# Patient Record
Sex: Female | Born: 1935 | Race: White | Hispanic: No | Smoking: Never smoker
Health system: Southern US, Community
[De-identification: ages and names within clinical notes are randomized; demographics above are authoritative.]

## PROBLEM LIST (undated history)

## (undated) DIAGNOSIS — E785 Hyperlipidemia, unspecified: Secondary | ICD-10-CM

## (undated) DIAGNOSIS — I2510 Atherosclerotic heart disease of native coronary artery without angina pectoris: Secondary | ICD-10-CM

## (undated) DIAGNOSIS — I10 Essential (primary) hypertension: Secondary | ICD-10-CM

## (undated) DIAGNOSIS — E039 Hypothyroidism, unspecified: Secondary | ICD-10-CM

## (undated) DIAGNOSIS — K219 Gastro-esophageal reflux disease without esophagitis: Secondary | ICD-10-CM

## (undated) DIAGNOSIS — F0390 Unspecified dementia without behavioral disturbance: Secondary | ICD-10-CM

## (undated) DIAGNOSIS — I213 ST elevation (STEMI) myocardial infarction of unspecified site: Secondary | ICD-10-CM

## (undated) DIAGNOSIS — I639 Cerebral infarction, unspecified: Secondary | ICD-10-CM

## (undated) HISTORY — DX: Atherosclerotic heart disease of native coronary artery without angina pectoris: I25.10

## (undated) HISTORY — DX: Hypothyroidism, unspecified: E03.9

## (undated) HISTORY — DX: Gastro-esophageal reflux disease without esophagitis: K21.9

---

## 2004-10-28 ENCOUNTER — Ambulatory Visit (HOSPITAL_COMMUNITY): Admission: RE | Admit: 2004-10-28 | Discharge: 2004-10-28 | Payer: Self-pay | Admitting: Internal Medicine

## 2005-12-12 ENCOUNTER — Ambulatory Visit (HOSPITAL_COMMUNITY): Admission: RE | Admit: 2005-12-12 | Discharge: 2005-12-12 | Payer: Self-pay | Admitting: Internal Medicine

## 2006-05-26 ENCOUNTER — Ambulatory Visit: Payer: Self-pay | Admitting: Vascular Surgery

## 2007-07-04 ENCOUNTER — Ambulatory Visit: Payer: Self-pay | Admitting: Vascular Surgery

## 2008-07-09 ENCOUNTER — Ambulatory Visit: Payer: Self-pay | Admitting: Vascular Surgery

## 2009-07-01 ENCOUNTER — Ambulatory Visit: Payer: Self-pay | Admitting: Vascular Surgery

## 2009-09-15 ENCOUNTER — Inpatient Hospital Stay (HOSPITAL_COMMUNITY): Admission: EM | Admit: 2009-09-15 | Discharge: 2009-09-21 | Payer: Self-pay | Admitting: Emergency Medicine

## 2009-09-15 ENCOUNTER — Ambulatory Visit: Payer: Self-pay | Admitting: Cardiology

## 2009-09-16 ENCOUNTER — Encounter (INDEPENDENT_AMBULATORY_CARE_PROVIDER_SITE_OTHER): Payer: Self-pay | Admitting: Internal Medicine

## 2010-05-08 LAB — BASIC METABOLIC PANEL
BUN: 18 mg/dL (ref 6–23)
BUN: 29 mg/dL — ABNORMAL HIGH (ref 6–23)
CO2: 22 mEq/L (ref 19–32)
CO2: 25 mEq/L (ref 19–32)
Calcium: 8.5 mg/dL (ref 8.4–10.5)
Chloride: 106 mEq/L (ref 96–112)
Chloride: 106 mEq/L (ref 96–112)
Chloride: 98 mEq/L (ref 96–112)
Creatinine, Ser: 1.03 mg/dL (ref 0.4–1.2)
Creatinine, Ser: 1.31 mg/dL — ABNORMAL HIGH (ref 0.4–1.2)
GFR calc Af Amer: >60 mL/min (ref >60)
GFR calc Af Amer: >60 mL/min (ref >60)
GFR calc non Af Amer: 48 mL/min — ABNORMAL LOW (ref >60)
Glucose, Bld: 180 mg/dL — ABNORMAL HIGH (ref 70–99)
Potassium: 3.8 mEq/L (ref 3.5–5.1)
Potassium: 3.8 mEq/L (ref 3.5–5.1)

## 2010-05-08 LAB — CBC
HCT: 36.8 % (ref 36.0–46.0)
HCT: 39 % (ref 36.0–46.0)
HCT: 44.5 % (ref 36.0–46.0)
Hemoglobin: 13.2 g/dL (ref 12.0–15.0)
MCH: 30.9 pg (ref 26.0–34.0)
MCH: 31.1 pg (ref 26.0–34.0)
MCH: 31.5 pg (ref 26.0–34.0)
MCHC: 33.9 g/dL (ref 30.0–36.0)
MCHC: 34.4 g/dL (ref 30.0–36.0)
MCHC: 34.6 g/dL (ref 30.0–36.0)
MCV: 90.6 fL (ref 78.0–100.0)
MCV: 91 fL (ref 78.0–100.0)
MCV: 91.1 fL (ref 78.0–100.0)
MCV: 91.5 fL (ref 78.0–100.0)
Platelets: 122 10*3/uL — ABNORMAL LOW (ref 150–400)
Platelets: 132 10*3/uL — ABNORMAL LOW (ref 150–400)
Platelets: 152 10*3/uL (ref 150–400)
RBC: 4.06 MIL/uL (ref 3.87–5.11)
RBC: 4.28 MIL/uL (ref 3.87–5.11)
RDW: 14 % (ref 11.5–15.5)
RDW: 14 % (ref 11.5–15.5)
RDW: 14.1 % (ref 11.5–15.5)
RDW: 14.3 % (ref 11.5–15.5)
WBC: 11.2 10*3/uL — ABNORMAL HIGH (ref 4.0–10.5)
WBC: 12.1 10*3/uL — ABNORMAL HIGH (ref 4.0–10.5)
WBC: 14.3 10*3/uL — ABNORMAL HIGH (ref 4.0–10.5)

## 2010-05-08 LAB — DIFFERENTIAL
Basophils Absolute: 0 10*3/uL (ref 0.0–0.1)
Basophils Absolute: 0 10*3/uL (ref 0.0–0.1)
Basophils Relative: 0 % (ref 0–1)
Basophils Relative: 0 % (ref 0–1)
Basophils Relative: 0 % (ref 0–1)
Eosinophils Absolute: 0 10*3/uL (ref 0.0–0.7)
Eosinophils Absolute: 0 10*3/uL (ref 0.0–0.7)
Eosinophils Absolute: 0 10*3/uL (ref 0.0–0.7)
Eosinophils Relative: 0 % (ref 0–5)
Eosinophils Relative: 0 % (ref 0–5)
Monocytes Absolute: 0.6 10*3/uL (ref 0.1–1.0)
Monocytes Absolute: 0.6 10*3/uL (ref 0.1–1.0)
Monocytes Relative: 4 % (ref 3–12)
Neutrophils Relative %: 92 % — ABNORMAL HIGH (ref 43–77)

## 2010-05-08 LAB — COMPREHENSIVE METABOLIC PANEL
ALT: 31 U/L (ref 0–35)
Alkaline Phosphatase: 43 U/L (ref 39–117)
Glucose, Bld: 144 mg/dL — ABNORMAL HIGH (ref 70–99)
Potassium: 3.7 mEq/L (ref 3.5–5.1)
Sodium: 136 mEq/L (ref 135–145)
Total Protein: 4.9 g/dL — ABNORMAL LOW (ref 6.0–8.3)

## 2010-05-08 LAB — URINALYSIS, ROUTINE W REFLEX MICROSCOPIC
Glucose, UA: NEGATIVE mg/dL
Leukocytes, UA: NEGATIVE
Protein, ur: 30 mg/dL — AB

## 2010-05-08 LAB — CULTURE, BLOOD (ROUTINE X 2): Culture: NO GROWTH

## 2010-05-08 LAB — CARDIAC PANEL(CRET KIN+CKTOT+MB+TROPI)
Relative Index: 3.7 — ABNORMAL HIGH (ref 0.0–2.5)
Relative Index: 3.9 — ABNORMAL HIGH (ref 0.0–2.5)
Relative Index: 5 — ABNORMAL HIGH (ref 0.0–2.5)
Relative Index: 5.9 — ABNORMAL HIGH (ref 0.0–2.5)
Total CK: 257 U/L — ABNORMAL HIGH (ref 7–177)
Total CK: 411 U/L — ABNORMAL HIGH (ref 7–177)
Troponin I: 1.3 ng/mL (ref 0.00–0.06)
Troponin I: 14.57 ng/mL (ref 0.00–0.06)
Troponin I: 5.99 ng/mL (ref 0.00–0.06)
Troponin I: 8.32 ng/mL (ref 0.00–0.06)

## 2010-05-08 LAB — POCT CARDIAC MARKERS

## 2010-05-08 LAB — TSH: TSH: 0.763 u[IU]/mL (ref 0.350–4.500)

## 2010-05-08 LAB — CLOSTRIDIUM DIFFICILE EIA

## 2010-05-08 LAB — LIPID PANEL
LDL Cholesterol: 57 mg/dL (ref 0–99)
Triglycerides: 70 mg/dL (ref <150)

## 2010-05-08 LAB — URINE MICROSCOPIC-ADD ON

## 2010-07-06 NOTE — Procedures (Signed)
CAROTID DUPLEX EXAM   INDICATION:  Follow up known carotid artery disease.   HISTORY:  Diabetes:  No.  Cardiac:  No.  Hypertension:  Yes.  Smoking:  No.  Previous Surgery:  No.  CV History:  No.  Amaurosis Fugax No, Paresthesias No, Hemiparesis No.                                       RIGHT             LEFT  Brachial systolic pressure:         210               190  Brachial Doppler waveforms:         WNL               WNL  Vertebral direction of flow:        Antegrade         Antegrade  DUPLEX VELOCITIES (cm/sec)  CCA peak systolic                   66                67  ECA peak systolic                   94                109  ICA peak systolic                   64                171  ICA end diastolic                   20                52  PLAQUE MORPHOLOGY:                  Calcific          Heterogenous  PLAQUE AMOUNT:                      Mild              Moderate  PLAQUE LOCATION:                    Bifurcation/ICA   Bifurcation/ICA   IMPRESSION:  1. Right internal carotid artery suggests 20% to 39% stenosis.  2. Left internal carotid artery suggests 40% to 59% stenosis.  3. Antegrade flow in bilateral vertebrals.   ___________________________________________  Janetta Hora Fields, MD   CB/MEDQ  D:  07/01/2009  T:  07/01/2009  Job:  [id number]

## 2010-07-06 NOTE — Procedures (Signed)
CAROTID DUPLEX EXAM   INDICATION:  Follow-up evaluation of known carotid artery disease.   HISTORY:  Diabetes:  No.  Cardiac:  No.  Hypertension:  Yes.  Smoking:  No.  Previous Surgery:  No.  CV History:  Patient reports no cerebrovascular symptoms at this time.  Previous duplex performed 05/26/06 revealed a 20-39% right ICA stenosis  and a 40-59% left ICA stenosis.  Amaurosis Fugax No, Paresthesias No, Hemiparesis No                                       RIGHT             LEFT  Brachial systolic pressure:         198               200  Brachial Doppler waveforms:         Triphasic         Triphasic  Vertebral direction of flow:        Antegrade         Antegrade  DUPLEX VELOCITIES (cm/sec)  CCA peak systolic                   52                58  ECA peak systolic                   79                75  ICA peak systolic                   66                152  ICA end diastolic                   15                31  PLAQUE MORPHOLOGY:                  Calcified, irregular                Calcified, irregular  PLAQUE AMOUNT:                      Moderate-to-severe                  Moderate  PLAQUE LOCATION:                    Proximal ICA      Proximal ICA   IMPRESSION:  1. 20-39% right internal carotid artery stenosis.  2. 40-59% left internal carotid artery stenosis.  3. No significant change from previous study performed 05/26/06.   ___________________________________________  Janetta Hora. Fields, MD   MC/MEDQ  D:  07/04/2007  T:  07/04/2007  Job:  [id number]

## 2010-07-06 NOTE — Procedures (Signed)
CAROTID DUPLEX EXAM   INDICATION:  Followup of known carotid artery disease.   HISTORY:  Diabetes:  No.  Cardiac:  No.  Hypertension:  Yes.  Smoking:  No.  Previous Surgery:  No.  CV History:  No.  Amaurosis Fugax No, Paresthesias No, Hemiparesis No.                                       RIGHT             LEFT  Brachial systolic pressure:         90  Brachial Doppler waveforms:         Biphasic          Biphasic  Vertebral direction of flow:        Antegrade         Antegrade  DUPLEX VELOCITIES (cm/sec)  CCA peak systolic                   46                160  ECA peak systolic                   75                98  ICA peak systolic                   88                188  ICA end diastolic                   18                47  PLAQUE MORPHOLOGY:                  Calcified         Heterogenous  PLAQUE AMOUNT:                      Moderate-to-severe                  Moderate-to-severe  PLAQUE LOCATION:                    BIF, ICA          BIF, ICA   IMPRESSION:  1. 20-39% right internal carotid artery stenosis.  2. 40-59% left internal carotid artery stenosis.     ___________________________________________  Janetta Hora Fields, MD   AC/MEDQ  D:  07/09/2008  T:  07/09/2008  Job:  [id number]

## 2010-07-08 ENCOUNTER — Other Ambulatory Visit: Payer: Self-pay

## 2011-09-14 IMAGING — CT CT HEAD W/O CM
1 of 5 series · 15 of 30 positions shown, 19 images · non-contrast
Comparison: None

CLINICAL DATA: Weakness.  Dehydration.  Fall.  Headache.

CT HEAD WITHOUT CONTRAST
TECHNIQUE: Contiguous axial images were obtained from the base of
the skull through the vertex without contrast.

[Series 8: head routine 2.4 h60s · axial · 0.43mm/px · z∈[+1134,+1266]mm · 15 of 60 slices shown, 19 images]
[im 4/60  brain]
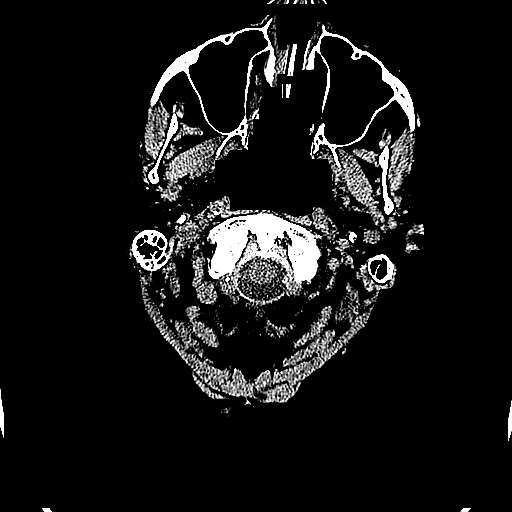
[im 4/60  bone]
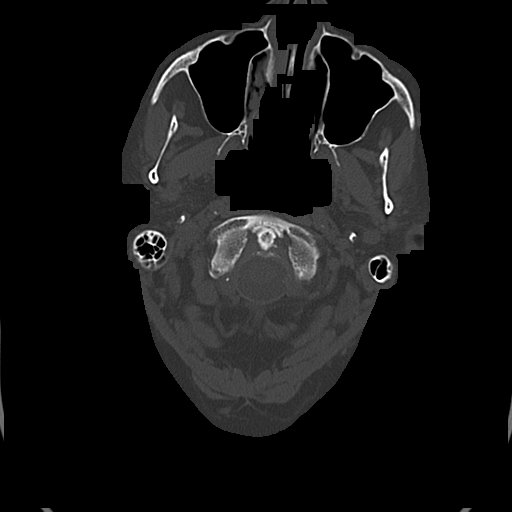
[im 8/60  brain]
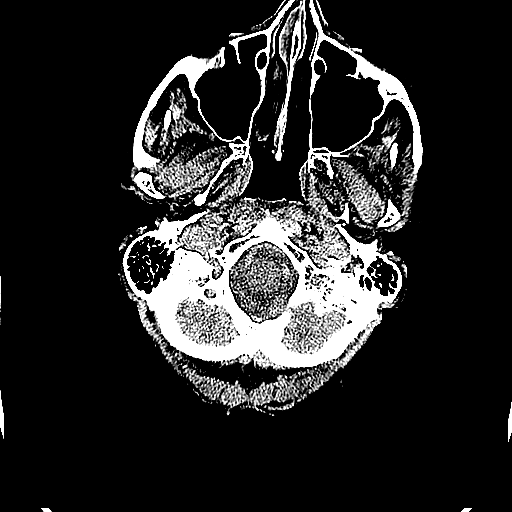
[im 12/60  brain]
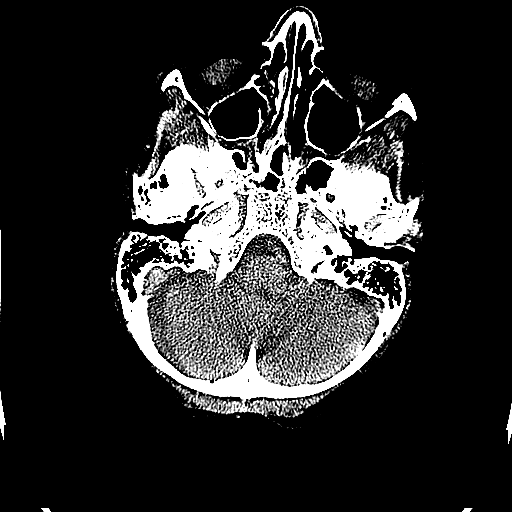
[im 15/60  brain]
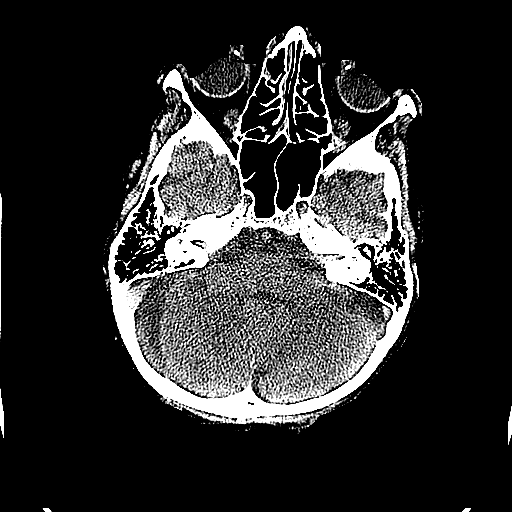
[im 19/60  brain]
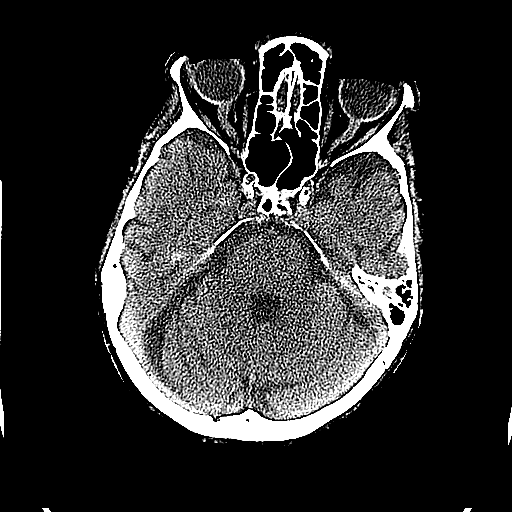
[im 19/60  bone]
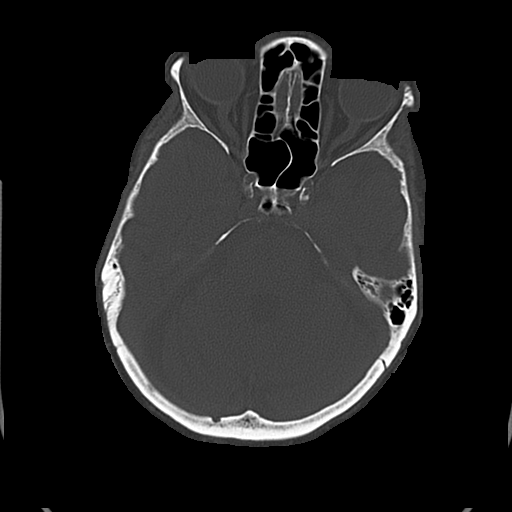
[im 23/60  brain]
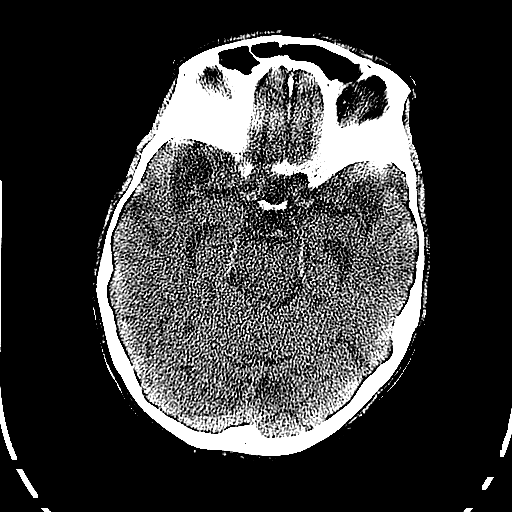
[im 26/60  brain]
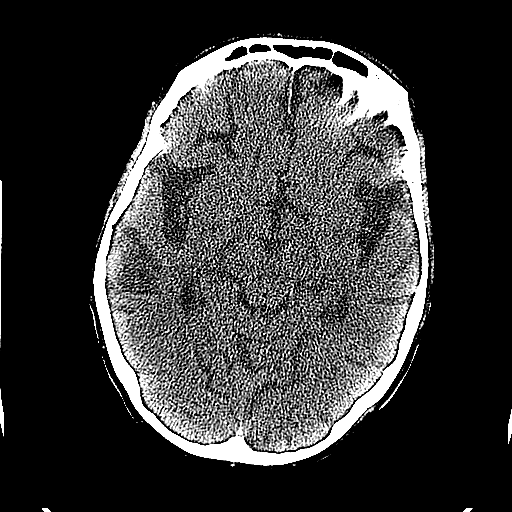
[im 30/60  brain]
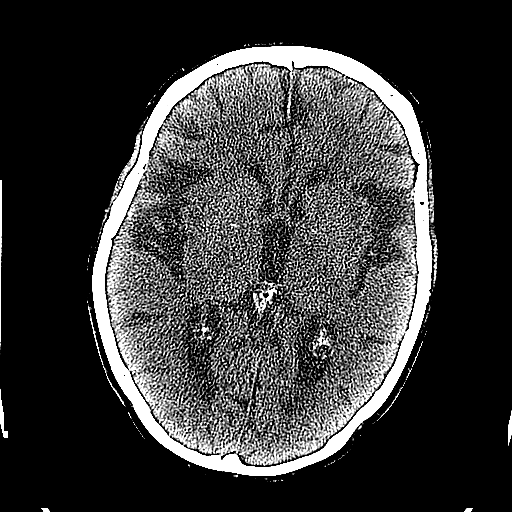
[im 34/60  brain]
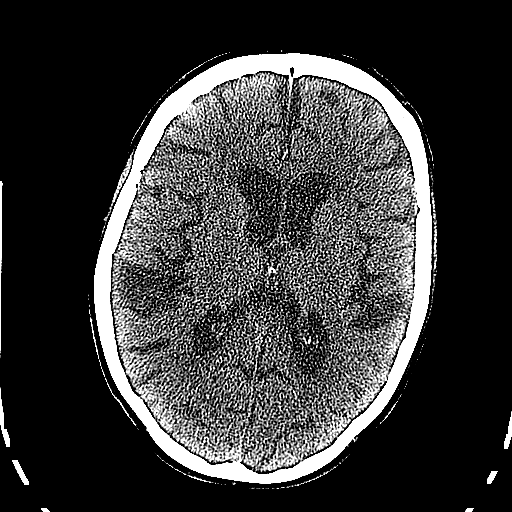
[im 34/60  bone]
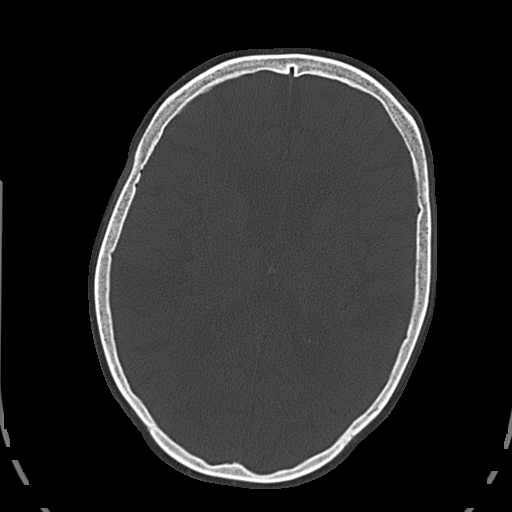
[im 37/60  brain]
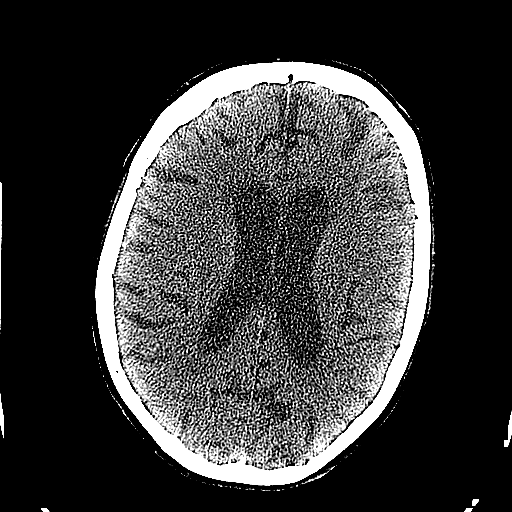
[im 41/60  brain]
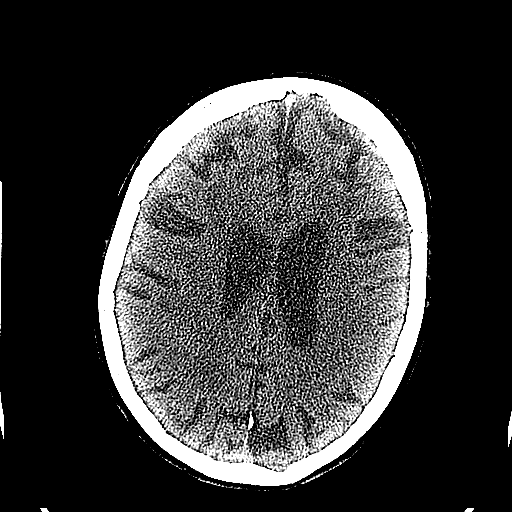
[im 45/60  brain]
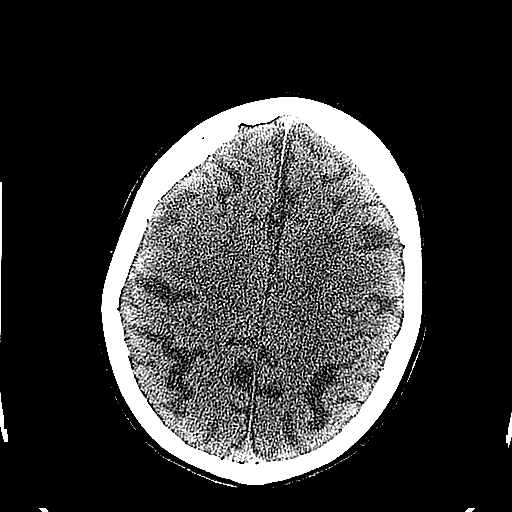
[im 48/60  brain]
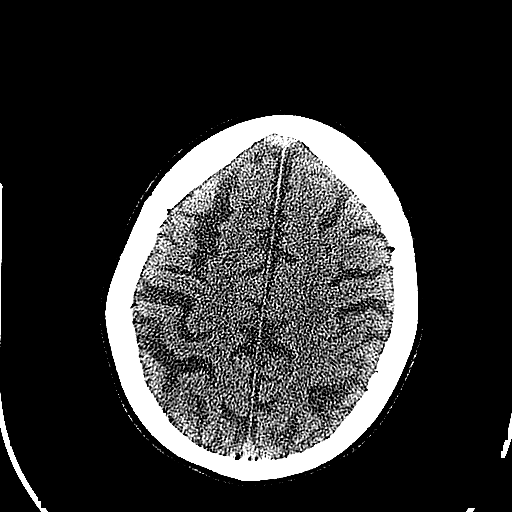
[im 48/60  bone]
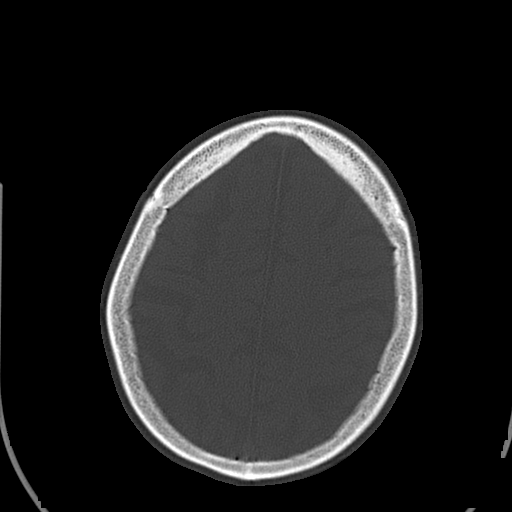
[im 52/60  brain]
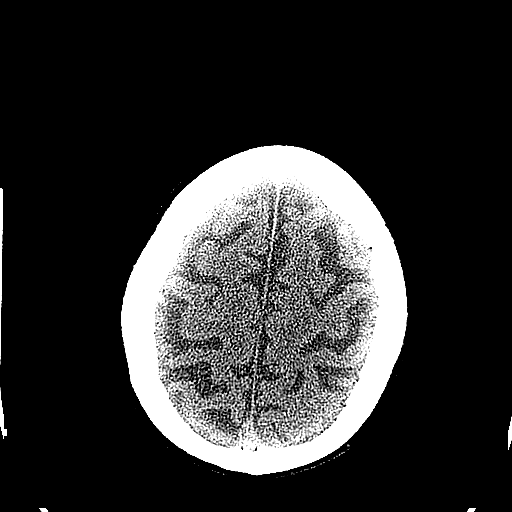
[im 56/60  brain]
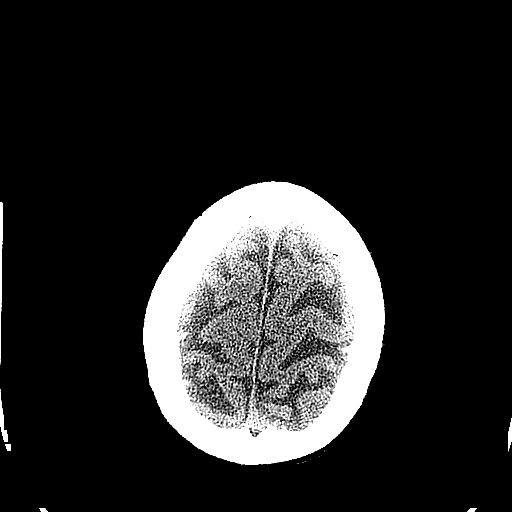

[15 of 30 positions shown; findings below may reference images not displayed]

FINDINGS: There is pronounced generalized brain atrophy.  There are
changes of chronic small vessel disease effect the hemispheric
white matter.  No sign of acute infarction, mass lesion,
hemorrhage, hydrocephalus or subdural hematoma.  There are low
density hygromas in the posterior fossa, not of any clinical
relevance.  No skull fractures seen.  No traumatic fluid in the
sinuses, middle ears or mastoids.
IMPRESSION: No acute finding.  No traumatic finding.  Atrophy and chronic small
vessel disease.

## 2011-12-13 ENCOUNTER — Encounter: Payer: Self-pay | Admitting: Vascular Surgery

## 2012-09-14 ENCOUNTER — Encounter (HOSPITAL_COMMUNITY): Payer: Self-pay

## 2012-09-14 ENCOUNTER — Inpatient Hospital Stay (HOSPITAL_COMMUNITY)
Admission: EM | Admit: 2012-09-14 | Discharge: 2012-09-22 | DRG: 871 | Disposition: A | Discharge status: Home Health | Payer: Medicare Other | Attending: Internal Medicine | Admitting: Internal Medicine

## 2012-09-14 ENCOUNTER — Emergency Department (HOSPITAL_COMMUNITY): Payer: Medicare Other

## 2012-09-14 ENCOUNTER — Inpatient Hospital Stay (HOSPITAL_COMMUNITY): Payer: Medicare Other

## 2012-09-14 ENCOUNTER — Observation Stay (HOSPITAL_COMMUNITY): Payer: Medicare Other

## 2012-09-14 DIAGNOSIS — I5020 Unspecified systolic (congestive) heart failure: Secondary | ICD-10-CM

## 2012-09-14 DIAGNOSIS — R112 Nausea with vomiting, unspecified: Secondary | ICD-10-CM

## 2012-09-14 DIAGNOSIS — I509 Heart failure, unspecified: Secondary | ICD-10-CM

## 2012-09-14 DIAGNOSIS — I2510 Atherosclerotic heart disease of native coronary artery without angina pectoris: Secondary | ICD-10-CM | POA: Diagnosis present

## 2012-09-14 DIAGNOSIS — I252 Old myocardial infarction: Secondary | ICD-10-CM

## 2012-09-14 DIAGNOSIS — E874 Mixed disorder of acid-base balance: Secondary | ICD-10-CM | POA: Diagnosis present

## 2012-09-14 DIAGNOSIS — J9600 Acute respiratory failure, unspecified whether with hypoxia or hypercapnia: Secondary | ICD-10-CM | POA: Diagnosis present

## 2012-09-14 DIAGNOSIS — R410 Disorientation, unspecified: Secondary | ICD-10-CM

## 2012-09-14 DIAGNOSIS — N170 Acute kidney failure with tubular necrosis: Secondary | ICD-10-CM | POA: Diagnosis present

## 2012-09-14 DIAGNOSIS — D72829 Elevated white blood cell count, unspecified: Secondary | ICD-10-CM

## 2012-09-14 DIAGNOSIS — J9601 Acute respiratory failure with hypoxia: Secondary | ICD-10-CM

## 2012-09-14 DIAGNOSIS — R509 Fever, unspecified: Secondary | ICD-10-CM

## 2012-09-14 DIAGNOSIS — N390 Urinary tract infection, site not specified: Secondary | ICD-10-CM | POA: Diagnosis present

## 2012-09-14 DIAGNOSIS — F0390 Unspecified dementia without behavioral disturbance: Secondary | ICD-10-CM | POA: Diagnosis present

## 2012-09-14 DIAGNOSIS — J189 Pneumonia, unspecified organism: Secondary | ICD-10-CM

## 2012-09-14 DIAGNOSIS — I10 Essential (primary) hypertension: Secondary | ICD-10-CM | POA: Diagnosis present

## 2012-09-14 DIAGNOSIS — R404 Transient alteration of awareness: Secondary | ICD-10-CM | POA: Diagnosis present

## 2012-09-14 DIAGNOSIS — R5381 Other malaise: Secondary | ICD-10-CM | POA: Diagnosis present

## 2012-09-14 DIAGNOSIS — N289 Disorder of kidney and ureter, unspecified: Secondary | ICD-10-CM

## 2012-09-14 DIAGNOSIS — I5023 Acute on chronic systolic (congestive) heart failure: Secondary | ICD-10-CM

## 2012-09-14 DIAGNOSIS — I214 Non-ST elevation (NSTEMI) myocardial infarction: Secondary | ICD-10-CM

## 2012-09-14 DIAGNOSIS — N179 Acute kidney failure, unspecified: Secondary | ICD-10-CM

## 2012-09-14 DIAGNOSIS — R7989 Other specified abnormal findings of blood chemistry: Secondary | ICD-10-CM

## 2012-09-14 DIAGNOSIS — R32 Unspecified urinary incontinence: Secondary | ICD-10-CM | POA: Diagnosis present

## 2012-09-14 DIAGNOSIS — I5043 Acute on chronic combined systolic (congestive) and diastolic (congestive) heart failure: Secondary | ICD-10-CM | POA: Diagnosis not present

## 2012-09-14 DIAGNOSIS — E785 Hyperlipidemia, unspecified: Secondary | ICD-10-CM | POA: Diagnosis present

## 2012-09-14 DIAGNOSIS — R7309 Other abnormal glucose: Secondary | ICD-10-CM | POA: Diagnosis present

## 2012-09-14 DIAGNOSIS — R6520 Severe sepsis without septic shock: Secondary | ICD-10-CM

## 2012-09-14 DIAGNOSIS — E876 Hypokalemia: Secondary | ICD-10-CM | POA: Diagnosis present

## 2012-09-14 DIAGNOSIS — E872 Acidosis, unspecified: Secondary | ICD-10-CM

## 2012-09-14 DIAGNOSIS — J690 Pneumonitis due to inhalation of food and vomit: Secondary | ICD-10-CM | POA: Diagnosis present

## 2012-09-14 DIAGNOSIS — A419 Sepsis, unspecified organism: Principal | ICD-10-CM

## 2012-09-14 HISTORY — DX: Unspecified dementia, unspecified severity, without behavioral disturbance, psychotic disturbance, mood disturbance, and anxiety: F03.90

## 2012-09-14 HISTORY — DX: Atherosclerotic heart disease of native coronary artery without angina pectoris: I25.10

## 2012-09-14 HISTORY — DX: Cerebral infarction, unspecified: I63.9

## 2012-09-14 HISTORY — DX: Hyperlipidemia, unspecified: E78.5

## 2012-09-14 HISTORY — DX: Essential (primary) hypertension: I10

## 2012-09-14 HISTORY — DX: ST elevation (STEMI) myocardial infarction of unspecified site: I21.3

## 2012-09-14 LAB — URINALYSIS W MICROSCOPIC + REFLEX CULTURE
Bilirubin Urine: NEGATIVE
Hgb urine dipstick: NEGATIVE
Specific Gravity, Urine: 1.013 (ref 1.005–1.030)
pH: 7 (ref 5.0–8.0)

## 2012-09-14 LAB — CBC WITH DIFFERENTIAL/PLATELET
Basophils Relative: 0 % (ref 0–1)
Eosinophils Absolute: 0 10*3/uL (ref 0.0–0.7)
Hemoglobin: 14.6 g/dL (ref 12.0–15.0)
Lymphs Abs: 0.4 10*3/uL — ABNORMAL LOW (ref 0.7–4.0)
MCH: 30.4 pg (ref 26.0–34.0)
MCHC: 34.8 g/dL (ref 30.0–36.0)
Monocytes Relative: 2 % — ABNORMAL LOW (ref 3–12)
Neutro Abs: 29.1 10*3/uL — ABNORMAL HIGH (ref 1.7–7.7)
Neutrophils Relative %: 97 % — ABNORMAL HIGH (ref 43–77)
Platelets: 274 10*3/uL (ref 150–400)
RBC: 4.8 MIL/uL (ref 3.87–5.11)
WBC Morphology: INCREASED

## 2012-09-14 LAB — COMPREHENSIVE METABOLIC PANEL
AST: 23 U/L (ref 0–37)
Albumin: 3.8 g/dL (ref 3.5–5.2)
BUN: 20 mg/dL (ref 6–23)
Chloride: 96 mEq/L (ref 96–112)
Creatinine, Ser: 1.21 mg/dL — ABNORMAL HIGH (ref 0.50–1.10)
Total Protein: 6.9 g/dL (ref 6.0–8.3)

## 2012-09-14 LAB — LACTIC ACID, PLASMA
Lactic Acid, Venous: 6.4 mmol/L — ABNORMAL HIGH (ref 0.5–2.2)
Lactic Acid, Venous: 6.8 mmol/L — ABNORMAL HIGH (ref 0.5–2.2)
Lactic Acid, Venous: 2.8 mmol/L — ABNORMAL HIGH (ref 0.5–2.2)

## 2012-09-14 LAB — PRO B NATRIURETIC PEPTIDE: Pro B Natriuretic peptide (BNP): 3827 pg/mL — ABNORMAL HIGH (ref 0–450)

## 2012-09-14 LAB — TROPONIN I
Troponin I: 0.44 ng/mL (ref <0.30)
Troponin I: 0.45 ng/mL (ref <0.30)
Troponin I: 1.87 ng/mL (ref <0.30)

## 2012-09-14 LAB — LIPASE, BLOOD: Lipase: 15 U/L (ref 11–59)

## 2012-09-14 MED ORDER — DEXTROSE-NACL 5-0.45 % IV SOLN
INTRAVENOUS | Status: DC
  Administered 2012-09-14: 19:00:00 via INTRAVENOUS

## 2012-09-14 MED ORDER — SODIUM CHLORIDE 0.9 % IJ SOLN
3.0000 mL | Freq: Two times a day (BID) | INTRAMUSCULAR | Status: DC
  Administered 2012-09-14 – 2012-09-18 (×5): 3 mL via INTRAVENOUS

## 2012-09-14 MED ORDER — SODIUM CHLORIDE 0.9 % IV SOLN
INTRAVENOUS | Status: DC
  Administered 2012-09-14: 12:00:00 via INTRAVENOUS

## 2012-09-14 MED ORDER — ALBUTEROL SULFATE (5 MG/ML) 0.5% IN NEBU
2.5000 mg | INHALATION_SOLUTION | RESPIRATORY_TRACT | Status: DC | PRN
Start: 2012-09-14 — End: 2012-09-17

## 2012-09-14 MED ORDER — PIPERACILLIN-TAZOBACTAM 3.375 G IVPB
3.3750 g | Freq: Three times a day (TID) | INTRAVENOUS | Status: DC
  Filled 2012-09-14: qty 50

## 2012-09-14 MED ORDER — ALBUTEROL SULFATE (5 MG/ML) 0.5% IN NEBU
5.0000 mg | INHALATION_SOLUTION | Freq: Four times a day (QID) | RESPIRATORY_TRACT | Status: DC
  Administered 2012-09-14: 5 mg via RESPIRATORY_TRACT
  Filled 2012-09-14: qty 1

## 2012-09-14 MED ORDER — PIPERACILLIN-TAZOBACTAM 3.375 G IVPB 30 MIN
3.3750 g | Freq: Once | INTRAVENOUS | Status: DC
  Administered 2012-09-14: 3.375 g via INTRAVENOUS
  Filled 2012-09-14: qty 50

## 2012-09-14 MED ORDER — ONDANSETRON HCL 4 MG/2ML IJ SOLN: 4.0000 mg | Freq: Four times a day (QID) | INTRAMUSCULAR | Status: DC | PRN

## 2012-09-14 MED ORDER — VANCOMYCIN HCL IN DEXTROSE 750-5 MG/150ML-% IV SOLN
750.0000 mg | INTRAVENOUS | Status: DC
  Administered 2012-09-14: 750 mg via INTRAVENOUS
  Filled 2012-09-14: qty 150

## 2012-09-14 MED ORDER — SODIUM CHLORIDE 0.9 % IJ SOLN: 3.0000 mL | INTRAMUSCULAR | Status: DC | PRN

## 2012-09-14 MED ORDER — DEXTROSE 5 % IV SOLN
1.0000 g | INTRAVENOUS | Status: DC
  Filled 2012-09-14: qty 10

## 2012-09-14 MED ORDER — ACETAMINOPHEN 325 MG PO TABS
650.0000 mg | ORAL_TABLET | Freq: Four times a day (QID) | ORAL | Status: DC | PRN
  Administered 2012-09-16: 650 mg via ORAL
  Filled 2012-09-14: qty 2

## 2012-09-14 MED ORDER — ACETAMINOPHEN 650 MG RE SUPP
650.0000 mg | Freq: Four times a day (QID) | RECTAL | Status: DC | PRN
  Administered 2012-09-15 – 2012-09-16 (×2): 650 mg via RECTAL
  Filled 2012-09-14 (×2): qty 1

## 2012-09-14 MED ORDER — CLOPIDOGREL BISULFATE 75 MG PO TABS
75.0000 mg | ORAL_TABLET | Freq: Every day | ORAL | Status: DC
  Administered 2012-09-15 – 2012-09-21 (×6): 75 mg via ORAL
  Filled 2012-09-14 (×10): qty 1

## 2012-09-14 MED ORDER — FUROSEMIDE 10 MG/ML IJ SOLN: 40.0000 mg | Freq: Once | INTRAMUSCULAR | Status: DC

## 2012-09-14 MED ORDER — SODIUM CHLORIDE 0.9 % IV SOLN
250.0000 mL | INTRAVENOUS | Status: DC | PRN
  Administered 2012-09-14: 250 mL via INTRAVENOUS

## 2012-09-14 MED ORDER — METOPROLOL TARTRATE 12.5 MG HALF TABLET
12.5000 mg | ORAL_TABLET | Freq: Two times a day (BID) | ORAL | Status: DC
  Administered 2012-09-14 – 2012-09-17 (×6): 12.5 mg via ORAL
  Filled 2012-09-14 (×7): qty 1

## 2012-09-14 MED ORDER — DEXTROSE 5 % IV SOLN
1.0000 g | INTRAVENOUS | Status: DC
  Administered 2012-09-14: 1 g via INTRAVENOUS
  Filled 2012-09-14 (×2): qty 10

## 2012-09-14 MED ORDER — ALBUTEROL SULFATE (5 MG/ML) 0.5% IN NEBU: 2.5000 mg | INHALATION_SOLUTION | Freq: Four times a day (QID) | RESPIRATORY_TRACT | Status: DC

## 2012-09-14 MED ORDER — ONDANSETRON HCL 4 MG PO TABS: 4.0000 mg | ORAL_TABLET | Freq: Four times a day (QID) | ORAL | Status: DC | PRN

## 2012-09-14 MED ORDER — LEVOTHYROXINE SODIUM 88 MCG PO TABS
88.0000 ug | ORAL_TABLET | Freq: Every day | ORAL | Status: DC
  Administered 2012-09-16 – 2012-09-22 (×7): 88 ug via ORAL
  Filled 2012-09-14 (×11): qty 1

## 2012-09-14 MED ORDER — ACETAMINOPHEN 650 MG RE SUPP
650.0000 mg | Freq: Once | RECTAL | Status: AC
  Administered 2012-09-14: 650 mg via RECTAL
  Filled 2012-09-14: qty 1

## 2012-09-14 MED ORDER — PANTOPRAZOLE SODIUM 40 MG IV SOLR
40.0000 mg | INTRAVENOUS | Status: DC
  Administered 2012-09-14 – 2012-09-17 (×4): 40 mg via INTRAVENOUS
  Filled 2012-09-14 (×6): qty 40

## 2012-09-14 NOTE — ED Notes (Signed)
[id number]<AV> Expected date:<BR> Expected time:<BR> Means of arrival:<BR> Comments:<BR> EMS-weak

## 2012-09-14 NOTE — ED Provider Notes (Signed)
CSN: [id number]     Arrival date & time 09/14/12  1120 History     First MD Initiated Contact with Patient 09/14/12 1127     Chief Complaint  Patient presents with  . Weakness  . Nausea    Patient is a 79 y.o. female presenting with weakness. The history is provided by a caregiver and a relative. The history is limited by the condition of the patient (Hx dementia).  Weakness  Pt was seen at 1135.  Per pt's son/caregiver, c/o gradual onset and worsening of persistent generalized weakness for the past 1 week, worse over the past day. Pt was seen by her PMD last week for a "strong odor" to her urine and "frequent urination," dx UTI, rx cipro. Pt was taking the cipro until she was seen by her PMD in f/u yesterday and then rx bactrim. Pt's son states she "won't stand up" today as well as "can't keep anything down" due to multiple intermittent episodes of N/V since yesterday.  Denies diarrhea, no fevers, no rash, no CP/SOB, no cough, no abd pain. Pt has significant hx of dementia, and is at her baseline per her son/caregiver whom she lives with.      Past Medical History  Diagnosis Date  . Hypertension   . Dementia   . Hyperlipidemia   . Coronary artery disease   . STEMI (ST elevation myocardial infarction)   . Hyperlipidemia   . Stroke    History reviewed. No pertinent past surgical history.  History  Substance Use Topics  . Smoking status: Never Smoker   . Smokeless tobacco: Never Used  . Alcohol Use: No    Review of Systems  Unable to perform ROS: Dementia  Neurological: Positive for weakness.    Allergies  Review of patient's allergies indicates no known allergies.  Home Medications   Current Outpatient Rx  Name  Route  Sig  Dispense  Refill  . amLODipine-benazepril (LOTREL) 10-20 MG per capsule   Oral   Take 1 capsule by mouth daily.         . cholecalciferol (VITAMIN D) 1000 UNITS tablet   Oral   Take 2,000 Units by mouth daily.         . ciprofloxacin  (CIPRO) 250 MG tablet   Oral   Take 250 mg by mouth 2 (two) times daily.         . clopidogrel (PLAVIX) 75 MG tablet   Oral   Take 75 mg by mouth daily with supper.         . donepezil (ARICEPT) 10 MG tablet   Oral   Take 10 mg by mouth at bedtime.         . fexofenadine (ALLEGRA) 180 MG tablet   Oral   Take 180 mg by mouth daily.         . isosorbide mononitrate (IMDUR) 30 MG 24 hr tablet   Oral   Take 30-60 mg by mouth daily. 30 mg in the morning 30 mg at supper and 30 mg around bedtime         . levothyroxine (SYNTHROID, LEVOTHROID) 88 MCG tablet   Oral   Take 88 mcg by mouth daily before breakfast.         . metoprolol tartrate (LOPRESSOR) 25 MG tablet   Oral   Take 12.5-25 mg by mouth 2 (two) times daily. 12.5 in the morning and 25 mg at bedtime         . nitroGLYCERIN (  NITROSTAT) 0.4 MG SL tablet   Sublingual   Place 0.4 mg under the tongue every 5 (five) minutes as needed for chest pain.         . pantoprazole (PROTONIX) 40 MG tablet   Oral   Take 40 mg by mouth daily.         . prochlorperazine (COMPAZINE) 5 MG tablet   Oral   Take 5 mg by mouth every 8 (eight) hours as needed for nausea.         Marland Kitchen sulfamethoxazole-trimethoprim (BACTRIM DS) 800-160 MG per tablet   Oral   Take 1 tablet by mouth 2 (two) times daily. 10 days         . tolterodine (DETROL) 2 MG tablet   Oral   Take 4 mg by mouth 2 (two) times daily.          BP 121/53  Pulse 92  Temp(Src) 100.8 F (38.2 C) (Rectal)  Resp 20  SpO2 91% Physical Exam 1140: Physical examination:  Nursing notes reviewed; Vital signs and O2 SAT reviewed;  Constitutional: Well developed, Well nourished, In no acute distress; Head:  Normocephalic, atraumatic; Eyes: EOMI, PERRL, No scleral icterus; ENMT: Mouth and pharynx normal, Mucous membranes dry; Neck: Supple, Full range of motion, No lymphadenopathy; Cardiovascular: Regular rate and rhythm, No gallop; Respiratory: Breath sounds coarse  & equal bilaterally, No wheezes.  Speaking full sentences, Normal respiratory effort/excursion; Chest: Nontender, Movement normal; Abdomen: Soft, Nontender, Nondistended, Normal bowel sounds; Genitourinary: No CVA tenderness; Extremities: Pulses normal, No tenderness, No edema, No calf edema or asymmetry.; Neuro: Awake, alert, confused re: time, place, events per hx of dementia. Major CN grossly intact. Speech clear. Moves all ext spontaneously on stretcher without apparent gross focal motor deficits.; Skin: Color normal, Warm, Dry.   ED Course   Procedures     MDM  MDM Reviewed: previous chart, nursing note and vitals Reviewed previous: labs and ECG Interpretation: labs, x-ray and ECG    Date: 09/14/2012  Rate: 95  Rhythm: normal sinus rhythm and premature atrial contractions (PAC), baseline wander, artifact  QRS Axis: normal  Intervals: PR prolonged, QTc 498  ST/T Wave abnormalities: nonspecific ST/T changes, diffuse ST depressions, no ST elevations, TWI lead aVL  Conduction Disutrbances:first-degree A-V block   Narrative Interpretation:   Old EKG Reviewed: changes noted; NS STTW changes are new compared to previous EKG dated 09/20/2009.     Date: 09/14/2012 repeated after troponin resulted  Rate: 103  Rhythm: sinus tachycardia  QRS Axis: normal  Intervals: QT prolonged, Qtc 524  ST/T Wave abnormalities: nonspecific ST/T changes, diffuse ST depressions, no ST elevations, TWI lead aVL  Conduction Disutrbances:none  Narrative Interpretation:   Old EKG Reviewed: changes noted; QTc prolonged compared to earlier EKG completed today.     Results for orders placed during the hospital encounter of 09/14/12  URINALYSIS W MICROSCOPIC + REFLEX CULTURE      Result Value Range   Color, Urine YELLOW  YELLOW   APPearance CLEAR  CLEAR   Specific Gravity, Urine 1.013  1.005 - 1.030   pH 7.0  5.0 - 8.0   Glucose, UA NEGATIVE  NEGATIVE mg/dL   Hgb urine dipstick NEGATIVE  NEGATIVE    Bilirubin Urine NEGATIVE  NEGATIVE   Ketones, ur NEGATIVE  NEGATIVE mg/dL   Protein, ur NEGATIVE  NEGATIVE mg/dL   Urobilinogen, UA 0.2  0.0 - 1.0 mg/dL   Nitrite NEGATIVE  NEGATIVE   Leukocytes, UA NEGATIVE  NEGATIVE  WBC, UA 0-2  <3 WBC/hpf   Bacteria, UA RARE  RARE   Squamous Epithelial / LPF RARE  RARE  CBC WITH DIFFERENTIAL      Result Value Range   WBC 30.1 (*) 4.0 - 10.5 K/uL   RBC 4.80  3.87 - 5.11 MIL/uL   Hemoglobin 14.6  12.0 - 15.0 g/dL   HCT 14.7  82.9 - 56.2 %   MCV 87.5  78.0 - 100.0 fL   MCH 30.4  26.0 - 34.0 pg   MCHC 34.8  30.0 - 36.0 g/dL   RDW 13.0  86.5 - 78.4 %   Platelets 274  150 - 400 K/uL   Neutrophils Relative % 97 (*) 43 - 77 %   Neutro Abs 29.1 (*) 1.7 - 7.7 K/uL   Lymphocytes Relative 1 (*) 12 - 46 %   Lymphs Abs 0.4 (*) 0.7 - 4.0 K/uL   Monocytes Relative 2 (*) 3 - 12 %   Monocytes Absolute 0.6  0.1 - 1.0 K/uL   Eosinophils Relative 0  0 - 5 %   Eosinophils Absolute 0.0  0.0 - 0.7 K/uL   Basophils Relative 0  0 - 1 %   Basophils Absolute 0.0  0.0 - 0.1 K/uL   WBC Morphology INCREASED BANDS (>20% BANDS)     RBC Morphology POLYCHROMASIA PRESENT     Smear Review PLATELET COUNT CONFIRMED BY SMEAR    COMPREHENSIVE METABOLIC PANEL      Result Value Range   Sodium 136  135 - 145 mEq/L   Potassium 3.6  3.5 - 5.1 mEq/L   Chloride 96  96 - 112 mEq/L   CO2 21  19 - 32 mEq/L   Glucose, Bld 244 (*) 70 - 99 mg/dL   BUN 20  6 - 23 mg/dL   Creatinine, Ser 6.96 (*) 0.50 - 1.10 mg/dL   Calcium 9.8  8.4 - 29.5 mg/dL   Total Protein 6.9  6.0 - 8.3 g/dL   Albumin 3.8  3.5 - 5.2 g/dL   AST 23  0 - 37 U/L   ALT 16  0 - 35 U/L   Alkaline Phosphatase 82  39 - 117 U/L   Total Bilirubin 0.5  0.3 - 1.2 mg/dL   GFR calc non Af Amer 38 (*) >90 mL/min   GFR calc Af Amer 44 (*) >90 mL/min  LIPASE, BLOOD      Result Value Range   Lipase 15  11 - 59 U/L  LACTIC ACID, PLASMA      Result Value Range   Lactic Acid, Venous 6.4 (*) 0.5 - 2.2 mmol/L  TROPONIN I       Result Value Range   Troponin I 0.44 (*) <0.30 ng/mL  PRO B NATRIURETIC PEPTIDE      Result Value Range   Pro B Natriuretic peptide (BNP) 3827.0 (*) 0 - 450 pg/mL   Dg Chest 2 View 09/14/2012   *RADIOLOGY REPORT*  Clinical Data: Weakness, coronary artery disease  CHEST - 2 VIEW  Comparison: September 20, 2009.  Findings: Cardiomediastinal silhouette appears normal.  No pleural effusion or pneumothorax is noted. Mild central pulmonary vascular congestion is noted with probable bilateral perihilar edema.  IMPRESSION: Mild central pulmonary vascular congestion is noted with probable mild bilateral perihilar edema.   Original Report Authenticated By: Lupita Raider.,  M.D.   Results for LEILYN, FRAYRE (MRN [id number]) as of 09/14/2012 13:53  Ref. Range 09/16/2009 10:30 09/17/2009 03:50 09/18/2009 03:40 09/19/2009  05:17 09/14/2012 12:04  BUN Latest Range: 6-23 mg/dL 18 18 19 19 20   Creatinine Latest Range: 0.50-1.10 mg/dL 1.91 4.78 2.95 6.21 3.08 (H)    Results for CYDNIE, DEASON (MRN [id number]) as of 09/14/2012 13:53  Ref. Range 09/16/2009 10:36 09/14/2012 12:04  Pro B Natriuretic peptide (BNP) Latest Range: 0-450 pg/mL 1225.0 (H) 3827.0 (H)     1325:  Significant leukocytosis and elevated lactic acid, but no clear source for infection. Abd remains soft/NT; though will obtain CT A/P without contrast (due to BUN/Cr elevation). UA without UTI (though has been on abx x1 week). CXR without clear infiltrate. Will obtain UC and BC. Appears CHF on CXR, BNP elevated from previous. Will dose judicious IVF. Troponin elevated, but no ST elevations on both EKG's performed in the ED. +fever, will dose APAP PR due to N/V. Dx and testing d/w pt's family.  Questions answered.  Verb understanding, agreeable to admit.  T/C to [facility] Dr. Sunnie Nielsen, case discussed, including:  HPI, pertinent PM/SHx, VS/PE, dx testing, ED course and treatment:  Agreeable to admit, requests to start broad spectrum abx, write temporary orders, obtain  stepdown bed to team 5.           Laray Anger, DO 09/15/12 1625

## 2012-09-14 NOTE — Progress Notes (Signed)
Ur completed     

## 2012-09-14 NOTE — Progress Notes (Signed)
CRITICAL VALUE ALERT  Critical value received:  Troponin 1.87  Date of notification:  09/14/2012  Time of notification:   2015  Critical value read back: yes  Nurse who received alert:  Conley Rolls, RN  MD notified (1st page):  [facility], 1  Time of first page:  2030  MD notified (2nd page):  Time of second page:  Responding MD:  [facility], 1  Time MD responded:  2035

## 2012-09-14 NOTE — ED Notes (Addendum)
Pt's son sts increased urination and weakness x 7 days.  Sts he called the PCP and Cipro was ordered x 6 days ago, with no relief.  Pt was seen by PCP yesterday and prescription was changed to Bactrim.  Sts she was "up walking yesterday and now can't get up."   Pt's son also sts nausea, which he thinks is from "snot running down her throat."

## 2012-09-14 NOTE — Progress Notes (Signed)
ANTIBIOTIC CONSULT NOTE - INITIAL  Pharmacy Consult for Vancomycin, Zosyn Indication: Empiric (fever, leukocytosis)  No Known Allergies  Patient Measurements: Height: 5\' 3"  (160 cm) Weight: 160 lb (72.576 kg) IBW/kg (Calculated) : 52.4  Vital Signs: Temp: 100.8 F (38.2 C) (07/25 1137) Temp src: Rectal (07/25 1137) BP: 121/53 mmHg (07/25 1131) Pulse Rate: 92 (07/25 1131)  Labs:  Recent Labs  09/14/12 1204  WBC 30.1*  HGB 14.6  PLT 274  CREATININE 1.21*   Estimated Creatinine Clearance: 29.5 ml/min (by C-G formula based on Cr of 1.21).  Microbiology: No results found for this or any previous visit (from the past 720 hour(s)).  Medical History: Past Medical History  Diagnosis Date  . Hypertension   . Dementia   . Hyperlipidemia   . Coronary artery disease   . STEMI (ST elevation myocardial infarction)   . Hyperlipidemia   . Stroke     Medications:  Anti-infectives   None     Assessment: 66 yoF presents [facility] ED with c/o weakness, nausea.  She was recently diagnosed with UTI and treated first with Cipro (7/21 - 7/24), then changed to Bactrim (7/24-7/25) per outpt PCP.  She is admitted to [facility] on 7/25.  Pharmacy is asked to dose vancomycin and Zosyn empirically for fever and leukocytosis.  SCr 1.21, CrCl ~ 29 ml/min.  WBC 30.1, Tm 100.8, Lactic acid 6.4  Blood cultures x2 are in process  Goal of Therapy:  Vancomycin trough level 15-20 mcg/ml Appropriate abx dosing, eradication of infection.   Plan:   Zosyn 3.375g IV Q8H infused over 4hrs.  Vancomycin 750mg  IV q24h.  Measure Vanc trough at steady state.  Follow up renal fxn and culture results.    Lynann Beaver PharmD, BCPS Pager [phone] 09/14/2012 2:35 PM

## 2012-09-14 NOTE — Consult Note (Signed)
CONSULTATION NOTE  Reason for Consult: Elevated troponin  Requesting Physician: Dr. Sunnie Nielsen  Cardiologist: Dr. Royann Shivers  HPI: This is a 79 y.o. female with a past medical history significant for coronary artery disease and has a moderately advanced dementia and therefore her treatment has been conservative. She has not been to see Dr. Royann Shivers in about 1 year and was doing fairly well without anginal complaints. She is being managed medically for her coronary disease. She had an NSTEMI in July of 2011 and has been managed medically since that time - she never had a heart catheterization. She now presents with what appears to be a sepsis syndrome. She recently was seen in the ER for foul smelling urine and given bactrim for a UTI. She apparently could not keep this down due to N/V and was put on cipro. In the ER she was febrile, apparently "projectile" vomiting and is noted to have a lactic acidosis of 6.8. She has denied any chest pain. Troponin is elevated at 0.44. Repeat 1 hour later was 0.45. BNP is elevated at 3827. WBC count is markedly elevated at 30K with a left shift.  We are asked to consult in the management of elevated troponin.  PMHx:  Past Medical History  Diagnosis Date  . Hypertension   . Dementia   . Hyperlipidemia   . Coronary artery disease   . STEMI (ST elevation myocardial infarction)   . Hyperlipidemia   . Stroke    History reviewed. No pertinent past surgical history.  FAMHx: No significant coronary history and not relevant as a risk factor given the patient's advanced age.  SOCHx:  reports that she has never smoked. She has never used smokeless tobacco. She reports that she does not drink alcohol or use illicit drugs.  ALLERGIES: No Known Allergies  ROS: A comprehensive review of systems was negative except for: Constitutional: positive for anorexia, chills, fatigue, fevers and malaise Respiratory: positive for dyspnea on exertion Gastrointestinal:  positive for abdominal pain, nausea and vomiting Genitourinary: positive for dysuria  HOME MEDICATIONS: Prescriptions prior to admission  Medication Sig Dispense Refill  . amLODipine-benazepril (LOTREL) 10-20 MG per capsule Take 1 capsule by mouth daily.      . cholecalciferol (VITAMIN D) 1000 UNITS tablet Take 2,000 Units by mouth daily.      . ciprofloxacin (CIPRO) 250 MG tablet Take 250 mg by mouth 2 (two) times daily.      . clopidogrel (PLAVIX) 75 MG tablet Take 75 mg by mouth daily with supper.      . donepezil (ARICEPT) 10 MG tablet Take 10 mg by mouth at bedtime.      . fexofenadine (ALLEGRA) 180 MG tablet Take 180 mg by mouth daily.      . isosorbide mononitrate (IMDUR) 30 MG 24 hr tablet Take 30-60 mg by mouth daily. 30 mg in the morning 30 mg at supper and 30 mg around bedtime      . levothyroxine (SYNTHROID, LEVOTHROID) 88 MCG tablet Take 88 mcg by mouth daily before breakfast.      . metoprolol tartrate (LOPRESSOR) 25 MG tablet Take 12.5-25 mg by mouth 2 (two) times daily. 12.5 in the morning and 25 mg at bedtime      . nitroGLYCERIN (NITROSTAT) 0.4 MG SL tablet Place 0.4 mg under the tongue every 5 (five) minutes as needed for chest pain.      . pantoprazole (PROTONIX) 40 MG tablet Take 40 mg by mouth daily.      Marland Kitchen  prochlorperazine (COMPAZINE) 5 MG tablet Take 5 mg by mouth every 8 (eight) hours as needed for nausea.      Marland Kitchen sulfamethoxazole-trimethoprim (BACTRIM DS) 800-160 MG per tablet Take 1 tablet by mouth 2 (two) times daily. 10 days      . tolterodine (DETROL) 2 MG tablet Take 4 mg by mouth 2 (two) times daily.        HOSPITAL MEDICATIONS: Prior to Admission:  Prescriptions prior to admission  Medication Sig Dispense Refill  . amLODipine-benazepril (LOTREL) 10-20 MG per capsule Take 1 capsule by mouth daily.      . cholecalciferol (VITAMIN D) 1000 UNITS tablet Take 2,000 Units by mouth daily.      . ciprofloxacin (CIPRO) 250 MG tablet Take 250 mg by mouth 2 (two) times  daily.      . clopidogrel (PLAVIX) 75 MG tablet Take 75 mg by mouth daily with supper.      . donepezil (ARICEPT) 10 MG tablet Take 10 mg by mouth at bedtime.      . fexofenadine (ALLEGRA) 180 MG tablet Take 180 mg by mouth daily.      . isosorbide mononitrate (IMDUR) 30 MG 24 hr tablet Take 30-60 mg by mouth daily. 30 mg in the morning 30 mg at supper and 30 mg around bedtime      . levothyroxine (SYNTHROID, LEVOTHROID) 88 MCG tablet Take 88 mcg by mouth daily before breakfast.      . metoprolol tartrate (LOPRESSOR) 25 MG tablet Take 12.5-25 mg by mouth 2 (two) times daily. 12.5 in the morning and 25 mg at bedtime      . nitroGLYCERIN (NITROSTAT) 0.4 MG SL tablet Place 0.4 mg under the tongue every 5 (five) minutes as needed for chest pain.      . pantoprazole (PROTONIX) 40 MG tablet Take 40 mg by mouth daily.      . prochlorperazine (COMPAZINE) 5 MG tablet Take 5 mg by mouth every 8 (eight) hours as needed for nausea.      Marland Kitchen sulfamethoxazole-trimethoprim (BACTRIM DS) 800-160 MG per tablet Take 1 tablet by mouth 2 (two) times daily. 10 days      . tolterodine (DETROL) 2 MG tablet Take 4 mg by mouth 2 (two) times daily.        VITALS: Blood pressure 121/82, pulse 96, temperature 100.8 F (38.2 C), temperature source Rectal, resp. rate 23, height 5\' 3"  (1.6 m), weight 148 lb 2.4 oz (67.2 kg), SpO2 100.00%.  PHYSICAL EXAM: General appearance: mild distress and toxic Neck: no adenopathy, no carotid bruit, no JVD, supple, symmetrical, trachea midline and thyroid not enlarged, symmetric, no tenderness/mass/nodules Lungs: clear to auscultation bilaterally Heart: regular rate and rhythm, S1, S2 normal, no murmur, click, rub or gallop Abdomen: mild diffuse tenderness to palpation, no rebound or guarding Extremities: extremities normal, atraumatic, no cyanosis or edema Pulses: 2+ and symmetric Skin: warm, pale, dry Neurologic: Mental status: somnolent, but awakens to voice, not  oriented  LABS: Results for orders placed during the hospital encounter of 09/14/12 (from the past 48 hour(s))  URINALYSIS W MICROSCOPIC + REFLEX CULTURE     Status: None   Collection Time    09/14/12 11:55 AM      Result Value Range   Color, Urine YELLOW  YELLOW   APPearance CLEAR  CLEAR   Specific Gravity, Urine 1.013  1.005 - 1.030   pH 7.0  5.0 - 8.0   Glucose, UA NEGATIVE  NEGATIVE mg/dL   Hgb urine  dipstick NEGATIVE  NEGATIVE   Bilirubin Urine NEGATIVE  NEGATIVE   Ketones, ur NEGATIVE  NEGATIVE mg/dL   Protein, ur NEGATIVE  NEGATIVE mg/dL   Urobilinogen, UA 0.2  0.0 - 1.0 mg/dL   Nitrite NEGATIVE  NEGATIVE   Leukocytes, UA NEGATIVE  NEGATIVE   WBC, UA 0-2  <3 WBC/hpf   Bacteria, UA RARE  RARE   Squamous Epithelial / LPF RARE  RARE  CBC WITH DIFFERENTIAL     Status: Abnormal   Collection Time    09/14/12 12:04 PM      Result Value Range   WBC 30.1 (*) 4.0 - 10.5 K/uL   RBC 4.80  3.87 - 5.11 MIL/uL   Hemoglobin 14.6  12.0 - 15.0 g/dL   HCT 47.8  29.5 - 62.1 %   MCV 87.5  78.0 - 100.0 fL   MCH 30.4  26.0 - 34.0 pg   MCHC 34.8  30.0 - 36.0 g/dL   RDW 30.8  65.7 - 84.6 %   Platelets 274  150 - 400 K/uL   Neutrophils Relative % 97 (*) 43 - 77 %   Neutro Abs 29.1 (*) 1.7 - 7.7 K/uL   Lymphocytes Relative 1 (*) 12 - 46 %   Lymphs Abs 0.4 (*) 0.7 - 4.0 K/uL   Monocytes Relative 2 (*) 3 - 12 %   Monocytes Absolute 0.6  0.1 - 1.0 K/uL   Eosinophils Relative 0  0 - 5 %   Eosinophils Absolute 0.0  0.0 - 0.7 K/uL   Basophils Relative 0  0 - 1 %   Basophils Absolute 0.0  0.0 - 0.1 K/uL   WBC Morphology INCREASED BANDS (>20% BANDS)     Comment: MILD LEFT SHIFT (1-5% METAS, OCC MYELO, OCC BANDS)   RBC Morphology POLYCHROMASIA PRESENT     Smear Review PLATELET COUNT CONFIRMED BY SMEAR    COMPREHENSIVE METABOLIC PANEL     Status: Abnormal   Collection Time    09/14/12 12:04 PM      Result Value Range   Sodium 136  135 - 145 mEq/L   Potassium 3.6  3.5 - 5.1 mEq/L   Chloride  96  96 - 112 mEq/L   CO2 21  19 - 32 mEq/L   Glucose, Bld 244 (*) 70 - 99 mg/dL   BUN 20  6 - 23 mg/dL   Creatinine, Ser 9.62 (*) 0.50 - 1.10 mg/dL   Calcium 9.8  8.4 - 95.2 mg/dL   Total Protein 6.9  6.0 - 8.3 g/dL   Albumin 3.8  3.5 - 5.2 g/dL   AST 23  0 - 37 U/L   ALT 16  0 - 35 U/L   Alkaline Phosphatase 82  39 - 117 U/L   Total Bilirubin 0.5  0.3 - 1.2 mg/dL   GFR calc non Af Amer 38 (*) >90 mL/min   GFR calc Af Amer 44 (*) >90 mL/min   Comment:            The eGFR has been calculated     using the CKD EPI equation.     This calculation has not been     validated in all clinical     situations.     eGFR's persistently     <90 mL/min signify     possible Chronic Kidney Disease.  LIPASE, BLOOD     Status: None   Collection Time    09/14/12 12:04 PM  Result Value Range   Lipase 15  11 - 59 U/L  TROPONIN I     Status: Abnormal   Collection Time    09/14/12 12:04 PM      Result Value Range   Troponin I 0.44 (*) <0.30 ng/mL   Comment:            Due to the release kinetics of cTnI,     a negative result within the first hours     of the onset of symptoms does not rule out     myocardial infarction with certainty.     If myocardial infarction is still suspected,     repeat the test at appropriate intervals.     CRITICAL RESULT CALLED TO, READ BACK BY AND VERIFIED WITH:     [id number]  PRO B NATRIURETIC PEPTIDE     Status: Abnormal   Collection Time    09/14/12 12:04 PM      Result Value Range   Pro B Natriuretic peptide (BNP) 3827.0 (*) 0 - 450 pg/mL  LACTIC ACID, PLASMA     Status: Abnormal   Collection Time    09/14/12 12:09 PM      Result Value Range   Lactic Acid, Venous 6.4 (*) 0.5 - 2.2 mmol/L  LACTIC ACID, PLASMA     Status: Abnormal   Collection Time    09/14/12  1:46 PM      Result Value Range   Lactic Acid, Venous 6.8 (*) 0.5 - 2.2 mmol/L  TROPONIN I     Status: Abnormal   Collection Time    09/14/12  1:46 PM      Result Value  Range   Troponin I 0.45 (*) <0.30 ng/mL   Comment:            Due to the release kinetics of cTnI,     a negative result within the first hours     of the onset of symptoms does not rule out     myocardial infarction with certainty.     If myocardial infarction is still suspected,     repeat the test at appropriate intervals.     CRITICAL VALUE NOTED.  VALUE IS CONSISTENT WITH PREVIOUSLY REPORTED AND CALLED VALUE.  BLOOD GAS, ARTERIAL     Status: Abnormal   Collection Time    09/14/12  2:22 PM      Result Value Range   FIO2 0.80     Delivery systems OXYGEN MASK     pH, Arterial 7.491 (*) 7.350 - 7.450   pCO2 arterial 27.2 (*) 35.0 - 45.0 mmHg   pO2, Arterial 105.0 (*) 80.0 - 100.0 mmHg   Bicarbonate 20.6  20.0 - 24.0 mEq/L   TCO2 17.6  0 - 100 mmol/L   Acid-base deficit 0.9  0.0 - 2.0 mmol/L   O2 Saturation 98.0     Patient temperature 98.6     Collection site REVIEWED BY     Drawn by [id number]     Sample type ARTERIAL DRAW    MRSA PCR SCREENING     Status: None   Collection Time    09/14/12  3:20 PM      Result Value Range   MRSA by PCR NEGATIVE  NEGATIVE   Comment:            The GeneXpert MRSA Assay (FDA     approved for NASAL specimens     only), is one component of a  comprehensive MRSA colonization     surveillance program. It is not     intended to diagnose MRSA     infection nor to guide or     monitor treatment for     MRSA infections.    IMAGING: Ct Abdomen Pelvis Wo Contrast  09/14/2012   *RADIOLOGY REPORT*  Clinical Data: Nausea and vomiting.  Recently treated for urinary tract infection.  History of dementia.  CT ABDOMEN AND PELVIS WITHOUT CONTRAST  Technique:  Multidetector CT imaging of the abdomen and pelvis was performed following the standard protocol without intravenous contrast.  Comparison: CT 09/17/2009  Findings: Images through the lung bases demonstrate mitral annular calcifications, a small hiatal hernia and mild chronic interstitial lung  disease.  Previously demonstrated pleural effusions have resolved.  The left kidney demonstrates a nonobstructing 5 mm calculus in its lower pole and cortical thinning.  There is no hydronephrosis or asymmetric perinephric soft tissue stranding.  No focal right renal abnormality is identified.  A calcification at the right renal hilum on image 26 appears vascular.  There is a stable low-density 2.0 cm left adrenal nodule on image 19, consistent with an adenoma.  The right adrenal gland appears normal.  As evaluated in the noncontrast state, the liver, gallbladder, pancreas and spleen appear unremarkable.  No enteric contrast was administered.  There is no significant bowel distension or focal wall thickening.  Stool is present throughout the colon.  There are sigmoid colon diverticular changes.  There is diffuse aorto iliac atherosclerosis with stable mildly eccentric dilatation of the distal aorta.  No enlarged abdominal pelvic lymph nodes are identified.  There is no adnexal mass.  The uterus, ovaries and bladder appear unremarkable. There is evidence of pelvic floor relaxation with inferior protuberance of the bladder and vagina.  Convex left lumbar scoliosis and associated spondylosis are noted. No acute osseous findings are seen.  IMPRESSION:  1.  No acute findings or explanation for vomiting identified. There is no evidence of bowel obstruction. 2.  No evidence of complicated urinary tract infection or hydronephrosis. 3.  Left renal atrophy and nonobstructing calculus in the lower pole of the left kidney are grossly stable. 4.  Pelvic floor relaxation.   Original Report Authenticated By: Carey Bullocks, M.D.   Dg Chest 2 View  09/14/2012   *RADIOLOGY REPORT*  Clinical Data: Weakness, coronary artery disease  CHEST - 2 VIEW  Comparison: September 20, 2009.  Findings: Cardiomediastinal silhouette appears normal.  No pleural effusion or pneumothorax is noted. Mild central pulmonary vascular congestion is noted with  probable bilateral perihilar edema.  IMPRESSION: Mild central pulmonary vascular congestion is noted with probable mild bilateral perihilar edema.   Original Report Authenticated By: Lupita Raider.,  M.D.    IMPRESSION: 1. Type 2 acute MI in the setting of urosepsis 2. Probable sepsis 3. History of NSTEMI with suspected CAD, medically managed  RECOMMENDATION: 1. Mrs. Inthavong appears to have had a Type II MI in the setting of dehydration, fever and the high-cardiac output state of sepsis. She had NSTEMI 3 years ago and was managed medically. She has stable angina and was fairly angina free, although she is quite sedentary, since then. She has had no anginal complaints on this admission.  Due to vomiting, she is not currently on any po medications. When this settles down, I would recommend restarting home plavix.  I do not feel there is indication for heparin in this situation - the goal is aggressive treatment of the  underlying infection. Can add back b-blocker and nitrate as blood pressure allows. No plans for cardiac catheterization. I spoke with her sons who were in the room when I consulted and they are in agreement with the plan.  Thanks for consulting [place]. I'm on call this weekend and will be rounding on her.  Time Spent Directly with Patient: 30 minutes  Chrystie Nose, MD, [facility] Attending Cardiologist [facility]  Aidyn Kellis C 09/14/2012, 4:56 PM

## 2012-09-14 NOTE — H&P (Signed)
[facility] History and Physical  Tracy Hicks ZOX:[id number] DOB: 1919/08/31 DOA: 09/14/2012  Referring physician: Dr Clarene Duke PCP: Nadean Corwin, MD  Specialists: Arnell Sieving.   Chief Complaint: Weakness, Vomiting.   HPI: Tracy Hicks is a 79 y.o. female with PMH significant for dementia, CAD, history of NSTEMI 2011 treated with medical treatment, HTN who was brought to the ED by son due to generalized weakness, decrease appetite, vomiting. Patient vomit the night prior to admission multiple times, fluid content, no blood on it. Patient has being receiving treatment for UTI with ciprofloxacin for last week. Patient usually cough constantly at home for last 7 years. 1 week prior to admission , son notice strong urine odor, and increase urination. Patient was started on ciprofloxacin by PCP for presume UTI. Patient was seen by PCP day prior to admission because of no improvement and patient was started on bactrim.   Patient was found to have WBC at 30,000, lactic acid at 6, increase BNP 3827, troponin at 0.44, Chest x ray with Mild central pulmonary vascular congestion is noted with probable mild bilateral perihilar edema.  In the ED patient started to have projectile vomit while RN was giving suppository. Patient was suctioned. Nebulizer treatment ordered. Patient is requiring more oxygen at this time.    Review of Systems: negative except as per HPI.   Past Medical History  Diagnosis Date  . Hypertension   . Dementia   . Hyperlipidemia   . Coronary artery disease   . STEMI (ST elevation myocardial infarction)   . Hyperlipidemia   . Stroke    History reviewed. No pertinent past surgical history. Social History:  reports that she has never smoked. She has never used smokeless tobacco. She reports that she does not drink alcohol or use illicit drugs.   No Known Allergies  Family History: non contributory. Unable to onbtain from patient.   Prior to Admission  medications   Medication Sig Start Date End Date Taking? Authorizing Provider  amLODipine-benazepril (LOTREL) 10-20 MG per capsule Take 1 capsule by mouth daily.   Yes Historical Provider, MD  cholecalciferol (VITAMIN D) 1000 UNITS tablet Take 2,000 Units by mouth daily.   Yes Historical Provider, MD  ciprofloxacin (CIPRO) 250 MG tablet Take 250 mg by mouth 2 (two) times daily.   Yes Historical Provider, MD  clopidogrel (PLAVIX) 75 MG tablet Take 75 mg by mouth daily with supper.   Yes Historical Provider, MD  donepezil (ARICEPT) 10 MG tablet Take 10 mg by mouth at bedtime.   Yes Historical Provider, MD  fexofenadine (ALLEGRA) 180 MG tablet Take 180 mg by mouth daily.   Yes Historical Provider, MD  isosorbide mononitrate (IMDUR) 30 MG 24 hr tablet Take 30-60 mg by mouth daily. 30 mg in the morning 30 mg at supper and 30 mg around bedtime   Yes Historical Provider, MD  levothyroxine (SYNTHROID, LEVOTHROID) 88 MCG tablet Take 88 mcg by mouth daily before breakfast.   Yes Historical Provider, MD  metoprolol tartrate (LOPRESSOR) 25 MG tablet Take 12.5-25 mg by mouth 2 (two) times daily. 12.5 in the morning and 25 mg at bedtime   Yes Historical Provider, MD  nitroGLYCERIN (NITROSTAT) 0.4 MG SL tablet Place 0.4 mg under the tongue every 5 (five) minutes as needed for chest pain.   Yes Historical Provider, MD  pantoprazole (PROTONIX) 40 MG tablet Take 40 mg by mouth daily.   Yes Historical Provider, MD  prochlorperazine (COMPAZINE) 5 MG tablet Take 5 mg by mouth  every 8 (eight) hours as needed for nausea.   Yes Historical Provider, MD  sulfamethoxazole-trimethoprim (BACTRIM DS) 800-160 MG per tablet Take 1 tablet by mouth 2 (two) times daily. 10 days   Yes Historical Provider, MD  tolterodine (DETROL) 2 MG tablet Take 4 mg by mouth 2 (two) times daily.   Yes Historical Provider, MD   Physical Exam: Filed Vitals:   09/14/12 1120 09/14/12 1131 09/14/12 1137 09/14/12 1410  BP:  121/53    Pulse:  92     Temp:  99.7 F (37.6 C) 100.8 F (38.2 C)   TempSrc:  Oral Rectal   Resp:  20    SpO2: 94% 91%  89%   General Appearance:    Somnolent, wake up to answer questions, mild respiratory  distress, appears stated age  Head:    Normocephalic, without obvious abnormality, atraumatic  Eyes:    PERRL, conjunctiva/corneas clear, EOM's intact,     Ears:    Normal TM's and external ear canals, both ears  Nose:   Nares normal, septum midline, mucosa normal, no drainage    or sinus tenderness  Throat:   Lips, mucosa, and tongue dry  Neck:   Supple, symmetrical, trachea midline, no adenopathy;    thyroid:  no enlargement/tenderness/nodules; no carotid   bruit or JVD  Back:     Symmetric, no curvature, ROM normal, no CVA tenderness  Lungs:     Bilateral Crackles, and expiratory wheezes.   Chest Wall:    No tenderness or deformity   Heart:    Regular rate and rhythm, S1 and S2 normal, no murmur, rub   or gallop     Abdomen:     Soft, non-tender, bowel sounds active all four quadrants,    no masses, no organomegaly        Extremities:   Extremities normal, atraumatic, no cyanosis or edema  Pulses:   2+ and symmetric all extremities  Skin:   Skin color, texture, turgor normal, no rashes or lesions  Lymph nodes:   Cervical, supraclavicular, and axillary nodes normal  Neurologic:   Somnolent but wake up to answer questions. Move all four extremities.     Labs on Admission:  Basic Metabolic Panel:  Recent Labs Lab 09/14/12 1204  NA 136  K 3.6  CL 96  CO2 21  GLUCOSE 244*  BUN 20  CREATININE 1.21*  CALCIUM 9.8   Liver Function Tests:  Recent Labs Lab 09/14/12 1204  AST 23  ALT 16  ALKPHOS 82  BILITOT 0.5  PROT 6.9  ALBUMIN 3.8    Recent Labs Lab 09/14/12 1204  LIPASE 15   No results found for this basename: AMMONIA,  in the last 168 hours CBC:  Recent Labs Lab 09/14/12 1204  WBC 30.1*  NEUTROABS 29.1*  HGB 14.6  HCT 42.0  MCV 87.5  PLT 274   Cardiac  Enzymes:  Recent Labs Lab 09/14/12 1204  TROPONINI 0.44*    BNP (last 3 results)  Recent Labs  09/14/12 1204  PROBNP 3827.0*   CBG: No results found for this basename: GLUCAP,  in the last 168 hours  Radiological Exams on Admission: Dg Chest 2 View  09/14/2012   *RADIOLOGY REPORT*  Clinical Data: Weakness, coronary artery disease  CHEST - 2 VIEW  Comparison: September 20, 2009.  Findings: Cardiomediastinal silhouette appears normal.  No pleural effusion or pneumothorax is noted. Mild central pulmonary vascular congestion is noted with probable bilateral perihilar edema.  IMPRESSION: Mild  central pulmonary vascular congestion is noted with probable mild bilateral perihilar edema.   Original Report Authenticated By: Lupita Raider.,  M.D.    EKG: st depression.   Assessment/Plan Active Problems:   Sepsis   PNA (pneumonia)   Systolic CHF   Troponin level elevated  1-Sepsis:  patient presents with lactic acidosis, lactic acid at 6, leukocytosis, fever. Source of infection could be aspiration PNA, Vs urine infection. Blood culture ordered. Patient started on Broad spectrum antibiotics, vancomycin and Zosyn.   2-Increase troponin: in setting of sepsis, heart failure. Will resume aspirin, plavix. Hold on metoprolol to avoid hypotension. Will defer start heparin to cardiologist. Earlyne Iba and vascular consulted.   3-Acute respiratory failure: Patient likely aspirated in the ED. Also component of systolic heart failure exacerbation. Will defer start lasix to cardiologist due to concern for sepsis. NPO status, nebulizer treatment. Patient started on partial NB. CCM consulted. ABG ordered.   4-Mild renal insufficiency: in setting of infection, vomiting, heart failure. Repeat renal function in am.  5-Nausea vomiting: Patient had a CT scan that show no acute finding. LFT normal.  6-Systolic Heart Failure probably acute on chronic: Patient presents with increase BNP, chest x ray with Mild  central pulmonary vascular congestion is noted with probable mild bilateral perihilar edema. Will hold on lasix at this time due to concern for Sepsis. Cardiologist consulted.        Code Status: Full Code, son want to discussed with rest of family.  Family Communication: Care discussed with son who was at bedside. I explain to son critical condition of Ms Bui. He needs to apeak with rest of family regarding CODE status.  Disposition Plan: expect 4 to 5 days.   Time spent: 75 minutes.   Yvonne Stopher [facility] Pager [phone]  If 7PM-7AM, please contact night-coverage [email or web address] Password [email or web address] 09/14/2012, 2:19 PM

## 2012-09-14 NOTE — ED Notes (Addendum)
Per EMS, Pt, from home, was diagnosed with a UTI x 4 days ago by PCP.  Pt was originally given Cipro and switched to a "sulfa" drug yesrterday.  Pt sts no relief and increased weakness/nausea.  Pt's son sts Pt "can not keep anything down."  Denies pain.  Hx of dementia.  Son sts Pt is at neuro baseline.  Vitals are stable.  NAD noted.

## 2012-09-14 NOTE — ED Notes (Signed)
Patient transported to CT 

## 2012-09-14 NOTE — H&P (Deleted)
PULMONARY  / CRITICAL CARE MEDICINE  Name: Tracy Hicks MRN: [id number] DOB: 1919/11/14    ADMISSION DATE:  09/14/2012 CONSULTATION DATE:  7/25  REFERRING MD :  Hartley Barefoot PRIMARY SERVICE: TR[facility]  CHIEF COMPLAINT:  Concern for Sepsis  BRIEF PATIENT DESCRIPTION: 79 y/o F admitted with probable UTI.  PCCM consulted for concern for sepsis.    SIGNIFICANT EVENTS / STUDIES:  7/25 - CT ABC/Pelvis>>  LINES / TUBES: PIV 7/25>>> Foley 7/25>>>  CULTURES: UA 7/25>>>0-2 wbc, neg nitrite, neg leuks  ANTIBIOTICS: Cipro / Bactrim >>>as outpatient x5 days pta ........................................................................Marland Kitchen Vanco 7/25>>>7/25 Zosyn 7/25>>    HISTORY OF PRESENT ILLNESS: 79 y/o F with PMH of HTN, Dementia, HLD, CAD s/p MI (medically managed by Dr. Elsworth Soho) who is followed by Dr. Oneta Rack as PCP.  She was seen 7/19 for frequency, urgency and foul smelling urine and was treated with bactrim.  Unfortunately, began having nausea / vomiting on 7/23 and was changed to Cipro on 7/24.  She continued to have nausea / vomiting after antibiotic ingestion.  Seen at PCP office x2 on 7/24 and gave urine sample.  7/24 PM patient stayed awake most of evening, Son Leonette Most - primary caregiver) noted she also had decreased appetite and weakness.    At baseline, she walks with a walker and gets out 3x per day for a drive in the car with her son (he drives).  She uses depends for urinary incontinence.  He assists her with ADLS and food prep.    In ER, work up noted clean UA (on abx for ~1 wk pta), mild acute kidney injury, hyperglycemia, mild troponin & BNP leak, and leukocytosis with left shift.  Patient had low grade temp of 100.8, lactic acid of 6.8 and normotensive BP.  She had an episode of n/v in ER while flat for rectal suppository insertion with concern for potential aspiration.   PAST MEDICAL HISTORY :  Past Medical History  Diagnosis Date  . Hypertension   . Dementia   .  Hyperlipidemia   . Coronary artery disease   . STEMI (ST elevation myocardial infarction)   . Hyperlipidemia   . Stroke    History reviewed. No pertinent past surgical history. Prior to Admission medications   Medication Sig Start Date End Date Taking? Authorizing Provider  amLODipine-benazepril (LOTREL) 10-20 MG per capsule Take 1 capsule by mouth daily.   Yes Historical Provider, MD  cholecalciferol (VITAMIN D) 1000 UNITS tablet Take 2,000 Units by mouth daily.   Yes Historical Provider, MD  ciprofloxacin (CIPRO) 250 MG tablet Take 250 mg by mouth 2 (two) times daily.   Yes Historical Provider, MD  clopidogrel (PLAVIX) 75 MG tablet Take 75 mg by mouth daily with supper.   Yes Historical Provider, MD  donepezil (ARICEPT) 10 MG tablet Take 10 mg by mouth at bedtime.   Yes Historical Provider, MD  fexofenadine (ALLEGRA) 180 MG tablet Take 180 mg by mouth daily.   Yes Historical Provider, MD  isosorbide mononitrate (IMDUR) 30 MG 24 hr tablet Take 30-60 mg by mouth daily. 30 mg in the morning 30 mg at supper and 30 mg around bedtime   Yes Historical Provider, MD  levothyroxine (SYNTHROID, LEVOTHROID) 88 MCG tablet Take 88 mcg by mouth daily before breakfast.   Yes Historical Provider, MD  metoprolol tartrate (LOPRESSOR) 25 MG tablet Take 12.5-25 mg by mouth 2 (two) times daily. 12.5 in the morning and 25 mg at bedtime   Yes Historical Provider, MD  nitroGLYCERIN (  NITROSTAT) 0.4 MG SL tablet Place 0.4 mg under the tongue every 5 (five) minutes as needed for chest pain.   Yes Historical Provider, MD  pantoprazole (PROTONIX) 40 MG tablet Take 40 mg by mouth daily.   Yes Historical Provider, MD  prochlorperazine (COMPAZINE) 5 MG tablet Take 5 mg by mouth every 8 (eight) hours as needed for nausea.   Yes Historical Provider, MD  sulfamethoxazole-trimethoprim (BACTRIM DS) 800-160 MG per tablet Take 1 tablet by mouth 2 (two) times daily. 10 days   Yes Historical Provider, MD  tolterodine (DETROL) 2 MG  tablet Take 4 mg by mouth 2 (two) times daily.   Yes Historical Provider, MD   No Known Allergies  FAMILY HISTORY:  History reviewed. No pertinent family history. SOCIAL HISTORY:  reports that she has never smoked. She has never used smokeless tobacco. She reports that she does not drink alcohol or use illicit drugs.  REVIEW OF SYSTEMS:  Unable to complete with patient due to dementia.  Medical information obtained from Sons and previous medical documentation.    SUBJECTIVE:   VITAL SIGNS: Temp:  [99.7 F (37.6 C)-100.8 F (38.2 C)] 100.8 F (38.2 C) (07/25 1137) Pulse Rate:  [92-97] 96 (07/25 1430) Resp:  [20-29] 23 (07/25 1430) BP: (121)/(53-82) 121/82 mmHg (07/25 1315) SpO2:  [89 %-100 %] 100 % (07/25 1430) FiO2 (%):  [50 %] 50 % (07/25 1410) Weight:  [160 lb (72.576 kg)] 160 lb (72.576 kg) (07/25 1428) HEMODYNAMICS:   VENTILATOR SETTINGS: Vent Mode:  [-]  FiO2 (%):  [50 %] 50 % INTAKE / OUTPUT: Intake/Output   None     PHYSICAL EXAMINATION: General:  Frail elderly female in NAD Neuro:  Sleepy but arouses and is appropriate HEENT:  Mm pink/dry, no JVD Cardiovascular:  s1s2 rrr, no m/r/g Lungs:  resp's even/non-labored, lungs bilaterally diminished Abdomen:  Round/soft, bsx4 hypo, non-tender Musculoskeletal:  No acute deformities Skin:  Warm/dry  LABS:  Recent Labs Lab 09/14/12 1204 09/14/12 1209 09/14/12 1346 09/14/12 1422  HGB 14.6  --   --   --   WBC 30.1*  --   --   --   PLT 274  --   --   --   NA 136  --   --   --   K 3.6  --   --   --   CL 96  --   --   --   CO2 21  --   --   --   GLUCOSE 244*  --   --   --   BUN 20  --   --   --   CREATININE 1.21*  --   --   --   CALCIUM 9.8  --   --   --   AST 23  --   --   --   ALT 16  --   --   --   ALKPHOS 82  --   --   --   BILITOT 0.5  --   --   --   PROT 6.9  --   --   --   ALBUMIN 3.8  --   --   --   LATICACIDVEN  --  6.4* 6.8*  --   TROPONINI 0.44*  --   --   --   PROBNP 3827.0*  --   --   --    PHART  --   --   --  7.491*  PCO2ART  --   --   --  27.2*  PO2ART  --   --   --  105.0*   No results found for this basename: GLUCAP,  in the last 168 hours  CXR: 7/25 - mild venous congestion  ASSESSMENT / PLAN:  PULMONARY A:  Respiratory Alkalosis - in setting of sepsis ? Aspiration Event - in ER on admit.  Initial cxr clear P:   -oxygen to support sats >92% -pulmonary hygiene -aspiration precautions -repeat cxr in am -PRN BD's   CARDIOVASCULAR A:  CAD Mild Troponin Leak - likely demand ischemia in setting of sepsis P:  -trend enzymes -SEHV consulted per primary -hold home anti-hypertensive regimen -cont plavix -repeat lactic acid to ensure clearance  RENAL A:   Anion Gap - Gap 19 P:   -monitor, correct underlying source -gentle hydration -insert foley, monitor I/O  GASTROINTESTINAL A:   Nausea / Vomiting - likely in setting of UTI.  CT ABD negative but lactic acidosis and high white count is concerning for potential abd ischemia on differential but would not be surgical candidate. P:   -PRN zofran -NPO for now, if no further vomiting then advance to clear liquid diet as tolerated -D51/2 NS while NPO  HEMATOLOGIC A:   Leukocytosis  P:  -see ID  INFECTIOUS A:   Recent UTI  Nausea / Vomiting, abdominal pain - concern for abd sepsis Leukocytosis, left shift P:   -Micro and abx as above  ENDOCRINE A:   Hyperglycemia - no hx of DM, stress response   P:   -monitor, if persistently elevated on BMP, consider SSI  NEUROLOGIC A:   Dementia  P:   -supportive care  Canary Brim, NP-C [facility] Pgr: [phone] or [phone]    I have personally obtained a history, examined the patient, evaluated laboratory and imaging results, formulated the assessment and plan and placed orders.  She has leukocytosis, leftward shift and lactic acidosis all c/w severe sepsis. The source of sepsis is unclear. Her UA is unimpressive and recent  suspected UTI is likely fully treated by abx prescribed PTA. Her chief symptoms are all abdominal but CT notably unremarkable. It is possible that the abx caused severe N/V and the leukocytosis is simply stress demargination. Alternatively she could have mesenteric ischemia (for which a CT is not sensitive) +/- bacterial translocation. I don't see much evidence on CXR for PNA. With this in mind, I think zosyn is probably the best abx choice for now. The likelihood of a resistant gram positive organism is low so will D/C Vanc   Billy Fischer, MD ; [facility] [phone].  After 5:30 PM or weekends, call [phone]    09/14/2012, 3:07 PM

## 2012-09-14 NOTE — ED Notes (Signed)
Patient transported to X-ray 

## 2012-09-14 NOTE — Consult Note (Signed)
PULMONARY  / CRITICAL CARE MEDICINE  Name: Tracy Hicks MRN: [id number] DOB: [birth year]    ADMISSION DATE:  09/14/2012 CONSULTATION DATE:  7/25  REFERRING MD :  Hartley Barefoot PRIMARY SERVICE: TRH  CHIEF COMPLAINT:  Concern for Sepsis  BRIEF PATIENT DESCRIPTION: 79 y/o F admitted with probable UTI.  PCCM consulted for concern for sepsis.    SIGNIFICANT EVENTS / STUDIES:  7/25 - CT ABC/Pelvis>>  LINES / TUBES: PIV 7/25>>> Foley 7/25>>>  CULTURES: UA 7/25>>>0-2 wbc, neg nitrite, neg leuks  ANTIBIOTICS: Cipro / Bactrim >>>as outpatient x5 days pta ........................................................................Marland Kitchen Vanco 7/25>>>7/25 Zosyn 7/25>>    HISTORY OF PRESENT ILLNESS: 79 y/o F with PMH of HTN, Dementia, HLD, CAD s/p MI (medically managed by Dr. Elsworth Soho) who is followed by Dr. Oneta Rack as PCP.  She was seen 7/19 for frequency, urgency and foul smelling urine and was treated with bactrim.  Unfortunately, began having nausea / vomiting on 7/23 and was changed to Cipro on 7/24.  She continued to have nausea / vomiting after antibiotic ingestion.  Seen at PCP office x2 on 7/24 and gave urine sample.  7/24 PM patient stayed awake most of evening, Son Leonette Most - primary caregiver) noted she also had decreased appetite and weakness.    At baseline, she walks with a walker and gets out 3x per day for a drive in the car with her son (he drives).  She uses depends for urinary incontinence.  He assists her with ADLS and food prep.    In ER, work up noted clean UA (on abx for ~1 wk pta), mild acute kidney injury, hyperglycemia, mild troponin & BNP leak, and leukocytosis with left shift.  Patient had low grade temp of 100.8, lactic acid of 6.8 and normotensive BP.  She had an episode of n/v in ER while flat for rectal suppository insertion with concern for potential aspiration.   PAST MEDICAL HISTORY :  Past Medical History  Diagnosis Date  . Hypertension   . Dementia   .  Hyperlipidemia   . Coronary artery disease   . STEMI (ST elevation myocardial infarction)   . Hyperlipidemia   . Stroke    History reviewed. No pertinent past surgical history. Prior to Admission medications   Medication Sig Start Date End Date Taking? Authorizing Provider  amLODipine-benazepril (LOTREL) 10-20 MG per capsule Take 1 capsule by mouth daily.   Yes Historical Provider, MD  cholecalciferol (VITAMIN D) 1000 UNITS tablet Take 2,000 Units by mouth daily.   Yes Historical Provider, MD  ciprofloxacin (CIPRO) 250 MG tablet Take 250 mg by mouth 2 (two) times daily.   Yes Historical Provider, MD  clopidogrel (PLAVIX) 75 MG tablet Take 75 mg by mouth daily with supper.   Yes Historical Provider, MD  donepezil (ARICEPT) 10 MG tablet Take 10 mg by mouth at bedtime.   Yes Historical Provider, MD  fexofenadine (ALLEGRA) 180 MG tablet Take 180 mg by mouth daily.   Yes Historical Provider, MD  isosorbide mononitrate (IMDUR) 30 MG 24 hr tablet Take 30-60 mg by mouth daily. 30 mg in the morning 30 mg at supper and 30 mg around bedtime   Yes Historical Provider, MD  levothyroxine (SYNTHROID, LEVOTHROID) 88 MCG tablet Take 88 mcg by mouth daily before breakfast.   Yes Historical Provider, MD  metoprolol tartrate (LOPRESSOR) 25 MG tablet Take 12.5-25 mg by mouth 2 (two) times daily. 12.5 in the morning and 25 mg at bedtime   Yes Historical Provider, MD  nitroGLYCERIN (  NITROSTAT) 0.4 MG SL tablet Place 0.4 mg under the tongue every 5 (five) minutes as needed for chest pain.   Yes Historical Provider, MD  pantoprazole (PROTONIX) 40 MG tablet Take 40 mg by mouth daily.   Yes Historical Provider, MD  prochlorperazine (COMPAZINE) 5 MG tablet Take 5 mg by mouth every 8 (eight) hours as needed for nausea.   Yes Historical Provider, MD  sulfamethoxazole-trimethoprim (BACTRIM DS) 800-160 MG per tablet Take 1 tablet by mouth 2 (two) times daily. 10 days   Yes Historical Provider, MD  tolterodine (DETROL) 2 MG  tablet Take 4 mg by mouth 2 (two) times daily.   Yes Historical Provider, MD   No Known Allergies  FAMILY HISTORY:  History reviewed. No pertinent family history. SOCIAL HISTORY:  reports that she has never smoked. She has never used smokeless tobacco. She reports that she does not drink alcohol or use illicit drugs.  REVIEW OF SYSTEMS:  Unable to complete with patient due to dementia.  Medical information obtained from Sons and previous medical documentation.    SUBJECTIVE:   VITAL SIGNS: Temp:  [97.1 F (36.2 C)-100.8 F (38.2 C)] 100.8 F (38.2 C) (07/25 2000) Pulse Rate:  [79-99] 79 (07/25 2140) Resp:  [20-29] 22 (07/25 2000) BP: (115-136)/(37-82) 129/44 mmHg (07/25 2140) SpO2:  [89 %-100 %] 100 % (07/25 2000) FiO2 (%):  [50 %] 50 % (07/25 1410) Weight:  [67.2 kg (148 lb 2.4 oz)-72.576 kg (160 lb)] 67.2 kg (148 lb 2.4 oz) (07/25 1520) HEMODYNAMICS:   VENTILATOR SETTINGS: Vent Mode:  [-]  FiO2 (%):  [50 %] 50 % INTAKE / OUTPUT: Intake/Output     07/25 0701 - 07/26 0700   I.V. (mL/kg) 48 (0.7)   IV Piggyback 100   Total Intake(mL/kg) 148 (2.2)   Urine (mL/kg/hr) 60   Total Output 60   Net +88         PHYSICAL EXAMINATION: General:  Frail elderly female in NAD Neuro:  Sleepy but arouses and is appropriate HEENT:  Mm pink/dry, no JVD Cardiovascular:  s1s2 rrr, no m/r/g Lungs:  resp's even/non-labored, lungs bilaterally diminished Abdomen:  Round/soft, bsx4 hypo, non-tender Musculoskeletal:  No acute deformities Skin:  Warm/dry  LABS:  Recent Labs Lab 09/14/12 1204 09/14/12 1209 09/14/12 1346 09/14/12 1422 09/14/12 1929 09/14/12 1932  HGB 14.6  --   --   --   --   --   WBC 30.1*  --   --   --   --   --   PLT 274  --   --   --   --   --   NA 136  --   --   --   --   --   K 3.6  --   --   --   --   --   CL 96  --   --   --   --   --   CO2 21  --   --   --   --   --   GLUCOSE 244*  --   --   --   --   --   BUN 20  --   --   --   --   --   CREATININE  1.21*  --   --   --   --   --   CALCIUM 9.8  --   --   --   --   --   AST 23  --   --   --   --   --  ALT 16  --   --   --   --   --   ALKPHOS 82  --   --   --   --   --   BILITOT 0.5  --   --   --   --   --   PROT 6.9  --   --   --   --   --   ALBUMIN 3.8  --   --   --   --   --   LATICACIDVEN  --  6.4* 6.8*  --   --  2.8*  TROPONINI 0.44*  --  0.45*  --  1.87*  --   PROBNP 3827.0*  --   --   --   --   --   PHART  --   --   --  7.491*  --   --   PCO2ART  --   --   --  27.2*  --   --   PO2ART  --   --   --  105.0*  --   --    No results found for this basename: GLUCAP,  in the last 168 hours  CXR: 7/25 - mild venous congestion  ASSESSMENT / PLAN:  PULMONARY A:  Respiratory Alkalosis - in setting of sepsis ? Aspiration Event - in ER on admit.  Initial cxr clear P:   -oxygen to support sats >92% -pulmonary hygiene -aspiration precautions -repeat cxr in am -PRN BD's   CARDIOVASCULAR A:  CAD Mild Troponin Leak - likely demand ischemia in setting of sepsis P:  -trend enzymes -SEHV consulted per primary -hold home anti-hypertensive regimen -cont plavix -repeat lactic acid to ensure clearance  RENAL A:   Anion Gap - Gap 19 P:   -monitor, correct underlying source -gentle hydration -insert foley, monitor I/O  GASTROINTESTINAL A:   Nausea / Vomiting - likely in setting of UTI.  CT ABD negative but lactic acidosis and high white count is concerning for potential abd ischemia on differential but would not be surgical candidate. P:   -PRN zofran -NPO for now, if no further vomiting then advance to clear liquid diet as tolerated -D51/2 NS while NPO  HEMATOLOGIC A:   Leukocytosis  P:  -see ID  INFECTIOUS A:   Recent UTI  Nausea / Vomiting, abdominal pain - concern for abd sepsis Leukocytosis, left shift P:   -Micro and abx as above  ENDOCRINE A:   Hyperglycemia - no hx of DM, stress response   P:   -monitor, if persistently elevated on BMP, consider  SSI  NEUROLOGIC A:   Dementia  P:   -supportive care  Canary Brim, NP-C [facility] Pgr: [phone] or [phone]    I have personally obtained a history, examined the patient, evaluated laboratory and imaging results, formulated the assessment and plan and placed orders.  She has leukocytosis, leftward shift and lactic acidosis all c/w severe sepsis. The source of sepsis is unclear. Her UA is unimpressive and recent suspected UTI is likely fully treated by abx prescribed PTA. Her chief symptoms are all abdominal but CT notably unremarkable. It is possible that the abx caused severe N/V and the leukocytosis is simply stress demargination. Alternatively she could have mesenteric ischemia (for which a CT is not sensitive) +/- bacterial translocation. I don't see much evidence on CXR for PNA. With this in mind, I think zosyn is probably the best abx choice for now. The likelihood of  a resistant gram positive organism is low so will D/C Vanc   Billy Fischer, MD ; [facility] [phone].  After 5:30 PM or weekends, call [phone]    09/14/2012, 11:06 PM

## 2012-09-14 NOTE — Progress Notes (Signed)
CARE MANAGEMENT NOTE 09/14/2012  Patient:  Tracy Hicks, Tracy Hicks   Account Number:  [id number]  Date Initiated:  09/14/2012  Documentation initiated by:  Tunis Gentle  Subjective/Objective Assessment:   pt with failed outpt treatment for urosepsis increased in confusion, wbc 36 on admit lactic acid 6.0     Action/Plan:   home with family   Anticipated DC Date:  09/17/2012   Anticipated DC Plan:  HOME W HOME HEALTH SERVICES  In-house referral  NA      DC Planning Services  NA      [facility] Choice  NA   Choice offered to / List presented to:  NA   DME arranged  NA      DME agency  NA     HH arranged  NA      HH agency  NA   Status of service:  In process, will continue to follow Medicare Important Message given?  NA - LOS <3 / Initial given by admissions (If response is "NO", the following Medicare IM given date fields will be blank) Date Medicare IM given:   Date Additional Medicare IM given:    Discharge Disposition:    Per UR Regulation:  Reviewed for med. necessity/level of care/duration of stay  If discussed at Long Length of Stay Meetings, dates discussed:    Comments:  Bjorn Loser Kemia Wendel,RN,BSN,CCM

## 2012-09-14 NOTE — ED Notes (Addendum)
Pt began to projectile vomit as this RN was giving the suppository.  Pt's mouth was suctioned and Pt remained on her side.  MD notified.

## 2012-09-15 ENCOUNTER — Inpatient Hospital Stay (HOSPITAL_COMMUNITY): Payer: Medicare Other

## 2012-09-15 DIAGNOSIS — J189 Pneumonia, unspecified organism: Secondary | ICD-10-CM

## 2012-09-15 DIAGNOSIS — I214 Non-ST elevation (NSTEMI) myocardial infarction: Secondary | ICD-10-CM

## 2012-09-15 LAB — COMPREHENSIVE METABOLIC PANEL
Albumin: 2.9 g/dL — ABNORMAL LOW (ref 3.5–5.2)
BUN: 22 mg/dL (ref 6–23)
Calcium: 8.7 mg/dL (ref 8.4–10.5)
Chloride: 99 mEq/L (ref 96–112)
Creatinine, Ser: 1.19 mg/dL — ABNORMAL HIGH (ref 0.50–1.10)
Total Bilirubin: 0.4 mg/dL (ref 0.3–1.2)
Total Protein: 5.7 g/dL — ABNORMAL LOW (ref 6.0–8.3)

## 2012-09-15 LAB — CBC
Hemoglobin: 13.9 g/dL (ref 12.0–15.0)
MCV: 87.2 fL (ref 78.0–100.0)
Platelets: 194 10*3/uL (ref 150–400)
RBC: 4.62 MIL/uL (ref 3.87–5.11)
WBC: 20.2 10*3/uL — ABNORMAL HIGH (ref 4.0–10.5)

## 2012-09-15 LAB — PROCALCITONIN: Procalcitonin: 0.3 ng/mL

## 2012-09-15 LAB — BLOOD GAS, ARTERIAL
Acid-Base Excess: 0.6 mmol/L (ref 0.0–2.0)
Drawn by: 310571
FIO2: 0.5 %
O2 Saturation: 88.8 %
Patient temperature: 98.6

## 2012-09-15 MED ORDER — ENOXAPARIN SODIUM 40 MG/0.4ML ~~LOC~~ SOLN: 40.0000 mg | SUBCUTANEOUS | Status: DC

## 2012-09-15 MED ORDER — PIPERACILLIN-TAZOBACTAM 3.375 G IVPB
3.3750 g | Freq: Three times a day (TID) | INTRAVENOUS | Status: DC
  Administered 2012-09-15 – 2012-09-18 (×10): 3.375 g via INTRAVENOUS
  Filled 2012-09-15 (×12): qty 50

## 2012-09-15 MED ORDER — ENOXAPARIN SODIUM 30 MG/0.3ML ~~LOC~~ SOLN
30.0000 mg | SUBCUTANEOUS | Status: DC
  Administered 2012-09-15 – 2012-09-21 (×7): 30 mg via SUBCUTANEOUS
  Filled 2012-09-15 (×7): qty 0.3

## 2012-09-15 NOTE — Progress Notes (Signed)
Pt. Seen and examined. Agree with the NP/PA-C note as written. Troponin has increased to 4, however, lactate is resolving. Leukocytosis is improved from 30K to 20K. Her color is better today. No complaints of chest pain. Continue medical therapy for NSTEMI.  Chrystie Nose, MD, [facility] Attending Cardiologist [facility]

## 2012-09-15 NOTE — Progress Notes (Signed)
[facility] PROGRESS NOTE  Tracy Hicks RUE:[id number] DOB: 28-Jul-1919 DOA: 09/14/2012 PCP: Nadean Corwin, MD  Assessment/Plan:  1-Sepsis: patient presents with lactic acidosis, lactic acid at 6, leukocytosis, fever. Source of infection could be aspiration PNA, Vs urine infection. Blood culture pending. Patient started on Broad spectrum antibiotics, vancomycin and Zosyn 7-25. Vancomycin was discontinue,. Continue with Zosyn. Follow urine culture.  Lactic acid decrease to 2.8.  2-NSTEMI: in setting of sepsis, heart failure.  resume aspirin, plavix when taking oral. Appreciate cardiologist assistance. Monitor BP on metoprolol. Troponin increase to 4.   3-Acute respiratory failure: Patient likely aspirated in the ED.  NPO status, nebulizer treatment. Still on significant amount of oxygen. CCM following. Chest x ray with increase infiltration right lower lobe. Continue with Zosyn to treat for aspiration PNA.   4-Mild renal insufficiency: in setting of infection, vomiting, heart failure. Cr decrease to 1.1.  5-Nausea vomiting: Patient had a CT scan that show no acute finding. LFT normal. Resolved. Continue with protonix.   6-Systolic Heart Failure  chronic: Patient presents with increase BNP, chest x ray with Mild central pulmonary vascular congestion is noted with probable mild bilateral perihilar edema. Will hold on lasix at this time due to concern for Sepsis. Cardiologist following.  7-Aspiration PNA: Chest x ray with increase infiltrates right lower lobe. Aspiration episode in the ED. Patient notice by family to have problems with upper respiratory secretion at home and chronic cough. Suspect she has been aspirating at home also. Speech therapy consulted. Continue with Zosyn.   DVT prophylaxis: start Lovenox. GI prophylaxis: protonix.    Code Status: Full Code.  Family Communication: Care discussed with Daughter who was at bedside.  Disposition Plan: Remain step down.     Consultants:  CCM  Cardiologist  Procedures:  none  Antibiotics:  Zosyn 7-25  Received one dose of Ceftriaxone.   HPI/Subjective: Patient calm, eyes close, answer some questions. Denies SO.   Objective: Filed Vitals:   09/14/12 2140 09/15/12 0000 09/15/12 0400 09/15/12 0500  BP: 129/44 150/54 118/38   Pulse: 79 76 73   Temp:  99.8 F (37.7 C) 97.5 F (36.4 C)   TempSrc:  Axillary Axillary   Resp:  25 26   Height:      Weight:    66.9 kg (147 lb 7.8 oz)  SpO2:  100% 98%     Intake/Output Summary (Last 24 hours) at 09/15/12 0721 Last data filed at 09/15/12 0600  Gross per 24 hour  Intake    548 ml  Output    505 ml  Net     43 ml   Filed Weights   09/14/12 1428 09/14/12 1520 09/15/12 0500  Weight: 72.576 kg (160 lb) 67.2 kg (148 lb 2.4 oz) 66.9 kg (147 lb 7.8 oz)    Exam:   General: No distress, sedated, eyes close, answer some question.   Cardiovascular: S 1, S 2 RRR  Respiratory: No wheezes, sporadic ronchus.   Abdomen: BS present, soft, NT  Musculoskeletal: No edema.   Data Reviewed: Basic Metabolic Panel:  Recent Labs Lab 09/14/12 1204 09/15/12 0148  NA 136 134*  K 3.6 3.8  CL 96 99  CO2 21 25  GLUCOSE 244* 137*  BUN 20 22  CREATININE 1.21* 1.19*  CALCIUM 9.8 8.7   Liver Function Tests:  Recent Labs Lab 09/14/12 1204 09/15/12 0148  AST 23 48*  ALT 16 27  ALKPHOS 82 66  BILITOT 0.5 0.4  PROT 6.9 5.7*  ALBUMIN  3.8 2.9*    Recent Labs Lab 09/14/12 1204  LIPASE 15   No results found for this basename: AMMONIA,  in the last 168 hours CBC:  Recent Labs Lab 09/14/12 1204 09/15/12 0148  WBC 30.1* 20.2*  NEUTROABS 29.1*  --   HGB 14.6 13.9  HCT 42.0 40.3  MCV 87.5 87.2  PLT 274 194   Cardiac Enzymes:  Recent Labs Lab 09/14/12 1204 09/14/12 1346 09/14/12 1929 09/15/12 0148  TROPONINI 0.44* 0.45* 1.87* 4.09*   BNP (last 3 results)  Recent Labs  09/14/12 1204  PROBNP 3827.0*   CBG: No results  found for this basename: GLUCAP,  in the last 168 hours  Recent Results (from the past 240 hour(s))  MRSA PCR SCREENING     Status: None   Collection Time    09/14/12  3:20 PM      Result Value Range Status   MRSA by PCR NEGATIVE  NEGATIVE Final   Comment:            The GeneXpert MRSA Assay (FDA     approved for NASAL specimens     only), is one component of a     comprehensive MRSA colonization     surveillance program. It is not     intended to diagnose MRSA     infection nor to guide or     monitor treatment for     MRSA infections.     Studies: Ct Abdomen Pelvis Wo Contrast  09/14/2012   *RADIOLOGY REPORT*  Clinical Data: Nausea and vomiting.  Recently treated for urinary tract infection.  History of dementia.  CT ABDOMEN AND PELVIS WITHOUT CONTRAST  Technique:  Multidetector CT imaging of the abdomen and pelvis was performed following the standard protocol without intravenous contrast.  Comparison: CT 09/17/2009  Findings: Images through the lung bases demonstrate mitral annular calcifications, a small hiatal hernia and mild chronic interstitial lung disease.  Previously demonstrated pleural effusions have resolved.  The left kidney demonstrates a nonobstructing 5 mm calculus in its lower pole and cortical thinning.  There is no hydronephrosis or asymmetric perinephric soft tissue stranding.  No focal right renal abnormality is identified.  A calcification at the right renal hilum on image 26 appears vascular.  There is a stable low-density 2.0 cm left adrenal nodule on image 19, consistent with an adenoma.  The right adrenal gland appears normal.  As evaluated in the noncontrast state, the liver, gallbladder, pancreas and spleen appear unremarkable.  No enteric contrast was administered.  There is no significant bowel distension or focal wall thickening.  Stool is present throughout the colon.  There are sigmoid colon diverticular changes.  There is diffuse aorto iliac atherosclerosis  with stable mildly eccentric dilatation of the distal aorta.  No enlarged abdominal pelvic lymph nodes are identified.  There is no adnexal mass.  The uterus, ovaries and bladder appear unremarkable. There is evidence of pelvic floor relaxation with inferior protuberance of the bladder and vagina.  Convex left lumbar scoliosis and associated spondylosis are noted. No acute osseous findings are seen.  IMPRESSION:  1.  No acute findings or explanation for vomiting identified. There is no evidence of bowel obstruction. 2.  No evidence of complicated urinary tract infection or hydronephrosis. 3.  Left renal atrophy and nonobstructing calculus in the lower pole of the left kidney are grossly stable. 4.  Pelvic floor relaxation.   Original Report Authenticated By: Carey Bullocks, M.D.   Dg Chest 2 View  09/14/2012   *RADIOLOGY REPORT*  Clinical Data: Weakness, coronary artery disease  CHEST - 2 VIEW  Comparison: September 20, 2009.  Findings: Cardiomediastinal silhouette appears normal.  No pleural effusion or pneumothorax is noted. Mild central pulmonary vascular congestion is noted with probable bilateral perihilar edema.  IMPRESSION: Mild central pulmonary vascular congestion is noted with probable mild bilateral perihilar edema.   Original Report Authenticated By: Lupita Raider.,  M.D.   Dg Chest Port 1 View  09/15/2012   *RADIOLOGY REPORT*  Clinical Data: Evaluate for infiltration.  PORTABLE CHEST - 1 VIEW  Comparison: 09/14/2012  Findings: Cardiac enlargement with mild pulmonary vascular congestion and perihilar edema.  Increasing infiltration in the right lower lung.  No definite pneumothorax or effusion. Calcification of the aorta.  IMPRESSION: Cardiac enlargement with pulmonary vascular congestion and perihilar edema.  Increasing infiltration in the right lower lung.   Original Report Authenticated By: Burman Nieves, M.D.    Scheduled Meds: . clopidogrel  75 mg Oral Q supper  . levothyroxine  88 mcg Oral  QAC breakfast  . metoprolol tartrate  12.5 mg Oral BID  . pantoprazole (PROTONIX) IV  40 mg Intravenous Q24H  . sodium chloride  3 mL Intravenous Q12H   Continuous Infusions: . dextrose 5 % and 0.45% NaCl 40 mL/hr at 09/14/12 1831    Active Problems:   Sepsis   PNA (pneumonia)   Systolic CHF   Troponin level elevated   Severe sepsis(995.92)   Lactic acid acidosis   Abdominal pain    Time spent: 35 minutes.     Leotis Isham  [facility] Pager [phone]. If 7PM-7AM, please contact night-coverage at [email or web address], password [facility] 09/15/2012, 7:21 AM  LOS: 1 day

## 2012-09-15 NOTE — Progress Notes (Signed)
[facility]  Subjective: Continues to deny chest pain.   Objective: Vital signs in last 24 hours: Temp:  [97.1 F (36.2 C)-100.8 F (38.2 C)] 98.5 F (36.9 C) (07/26 1200) Pulse Rate:  [73-99] 78 (07/26 1100) Resp:  [22-26] 24 (07/26 1100) BP: (105-151)/(37-78) 105/39 mmHg (07/26 1100) SpO2:  [89 %-100 %] 90 % (07/26 1100) FiO2 (%):  [50 %] 50 % (07/25 1410) Weight:  [147 lb 7.8 oz (66.9 kg)-160 lb (72.576 kg)] 147 lb 7.8 oz (66.9 kg) (07/26 0500)    Intake/Output from previous day: 07/25 0701 - 07/26 0700 In: 588 [I.V.:488; IV Piggyback:100] Out: 505 [Urine:505] Intake/Output this shift: Total I/O In: 265 [I.V.:240; IV Piggyback:25] Out: -   Medications Current Facility-Administered Medications  Medication Dose Route Frequency Provider Last Rate Last Dose  . 0.9 %  sodium chloride infusion  250 mL Intravenous PRN Belkys A Regalado, MD   250 mL at 09/14/12 1612  . acetaminophen (TYLENOL) tablet 650 mg  650 mg Oral Q6H PRN Belkys A Regalado, MD       Or  . acetaminophen (TYLENOL) suppository 650 mg  650 mg Rectal Q6H PRN Belkys A Regalado, MD   650 mg at 09/15/12 0303  . albuterol (PROVENTIL) (5 MG/ML) 0.5% nebulizer solution 2.5 mg  2.5 mg Nebulization Q4H PRN Jeanella Craze, NP      . clopidogrel (PLAVIX) tablet 75 mg  75 mg Oral Q supper Belkys A Regalado, MD      . dextrose 5 %-0.45 % sodium chloride infusion   Intravenous Continuous Jeanella Craze, NP 40 mL/hr at 09/14/12 1831    . enoxaparin (LOVENOX) injection 30 mg  30 mg Subcutaneous Q24H Belkys A Regalado, MD   30 mg at 09/15/12 1114  . levothyroxine (SYNTHROID, LEVOTHROID) tablet 88 mcg  88 mcg Oral QAC breakfast Belkys A Regalado, MD      . metoprolol tartrate (LOPRESSOR) tablet 12.5 mg  12.5 mg Oral BID Jinger Neighbors, NP   12.5 mg at 09/15/12 1000  . ondansetron (ZOFRAN) tablet 4 mg  4 mg Oral Q6H PRN Belkys A Regalado, MD       Or  . ondansetron (ZOFRAN) injection 4 mg  4 mg  Intravenous Q6H PRN Belkys A Regalado, MD      . pantoprazole (PROTONIX) injection 40 mg  40 mg Intravenous Q24H Belkys A Regalado, MD   40 mg at 09/14/12 1631  . piperacillin-tazobactam (ZOSYN) IVPB 3.375 g  3.375 g Intravenous Q8H Belkys A Regalado, MD   3.375 g at 09/15/12 0835  . sodium chloride 0.9 % injection 3 mL  3 mL Intravenous Q12H Belkys A Regalado, MD   3 mL at 09/14/12 2141  . sodium chloride 0.9 % injection 3 mL  3 mL Intravenous PRN Belkys A Regalado, MD        PE: General appearance: alert, cooperative and no distress Lungs: clear to auscultation bilaterally Heart: regular rate and rhythm Pulses: 2+ and symmetric Skin: warm and dry Neurologic: Grossly normal  Lab Results:   Recent Labs  09/14/12 1204 09/15/12 0148  WBC 30.1* 20.2*  HGB 14.6 13.9  HCT 42.0 40.3  PLT 274 194   BMET  Recent Labs  09/14/12 1204 09/15/12 0148  NA 136 134*  K 3.6 3.8  CL 96 99  CO2 21 25  GLUCOSE 244* 137*  BUN 20 22  CREATININE 1.21* 1.19*  CALCIUM 9.8 8.7   Cardiac Panel (last 3  results)  Recent Labs  09/14/12 1346 09/14/12 1929 09/15/12 0148  TROPONINI 0.45* 1.87* 4.09*    Assessment/Plan  Active Problems:   Sepsis   PNA (pneumonia)   Systolic CHF   Troponin level elevated   Severe sepsis(995.92)   Lactic acid acidosis   Abdominal pain   Acute respiratory failure with hypoxia   Delirium   Acute renal failure  Plan: Troponin increased to 4.09. The patient has remained chest pain free. Continue on medical therapy. She is now tolerating PO intake. Start Plavix today. Will need to add BB and long acting nitrate if BP allows. Will continue to follow.     LOS: 1 day    Tracy Hicks 09/15/2012 1:31 PM

## 2012-09-15 NOTE — Progress Notes (Signed)
I came to room to follow up on patient. I was able to speak with Tracy Hicks (Son). He will meet with his siblings to talk about Code status. He dint want to meet with me to talk about CODE status again. He said, we have been taking with multiple Drs about the same.  Judaea Burgoon.

## 2012-09-15 NOTE — Progress Notes (Signed)
ANTIBIOTIC CONSULT NOTE - INITIAL  Pharmacy Consult for Zosyn Indication: Aspiration pneumonia vs UTI  No Known Allergies  Patient Measurements: Height: 5\' 3"  (160 cm) Weight: 147 lb 7.8 oz (66.9 kg) IBW/kg (Calculated) : 52.4  Vital Signs: Temp: 97.5 F (36.4 C) (07/26 0400) Temp src: Axillary (07/26 0400) BP: 118/38 mmHg (07/26 0400) Pulse Rate: 73 (07/26 0400)  Labs:  Recent Labs  09/14/12 1204 09/15/12 0148  WBC 30.1* 20.2*  HGB 14.6 13.9  PLT 274 194  CREATININE 1.21* 1.19*   Estimated Creatinine Clearance: 28.9 ml/min (by C-G formula based on Cr of 1.19).  Microbiology: Recent Results (from the past 720 hour(s))  MRSA PCR SCREENING     Status: None   Collection Time    09/14/12  3:20 PM      Result Value Range Status   MRSA by PCR NEGATIVE  NEGATIVE Final   Comment:            The GeneXpert MRSA Assay (FDA     approved for NASAL specimens     only), is one component of a     comprehensive MRSA colonization     surveillance program. It is not     intended to diagnose MRSA     infection nor to guide or     monitor treatment for     MRSA infections.    Medical History: Past Medical History  Diagnosis Date  . Hypertension   . Dementia   . Hyperlipidemia   . Coronary artery disease   . STEMI (ST elevation myocardial infarction)   . Hyperlipidemia   . Stroke     Medications:  Anti-infectives   Start     Dose/Rate Route Frequency Ordered Stop   09/14/12 2200  piperacillin-tazobactam (ZOSYN) IVPB 3.375 g  Status:  Discontinued     3.375 g 12.5 mL/hr over 240 Minutes Intravenous 3 times per day 09/14/12 1440 09/14/12 1618   09/14/12 2200  cefTRIAXone (ROCEPHIN) 1 g in dextrose 5 % 50 mL IVPB  Status:  Discontinued     1 g 100 mL/hr over 30 Minutes Intravenous Every 24 hours 09/14/12 1632 09/14/12 2300   09/14/12 1700  cefTRIAXone (ROCEPHIN) 1 g in dextrose 5 % 50 mL IVPB  Status:  Discontinued     1 g 100 mL/hr over 30 Minutes Intravenous Every  24 hours 09/14/12 1613 09/14/12 1632   09/14/12 1500  piperacillin-tazobactam (ZOSYN) IVPB 3.375 g  Status:  Discontinued     3.375 g 100 mL/hr over 30 Minutes Intravenous  Once 09/14/12 1435 09/14/12 1618   09/14/12 1500  vancomycin (VANCOCIN) IVPB 750 mg/150 ml premix  Status:  Discontinued     750 mg 150 mL/hr over 60 Minutes Intravenous Every 24 hours 09/14/12 1436 09/14/12 1613     Assessment: 89 yoF presents to [facility] ED with c/o weakness, nausea.  She was recently diagnosed with UTI and treated first with Cipro (7/21 - 7/24), then changed to Bactrim (7/24-7/25) per outpt PCP.  She is admitted to [facility] on 7/25.  Pharmacy initially was asked to dose Vancomycin and Zosyn empirically for fever and leukocytosis. But both were stopped after one dose. Zosyn per pharmacy resumed 7/26.  SCr 1.19, CrCl ~ 29 ml/min.  WBC elevated but improving, Tm 100.8  Cultures in process  Goal of Therapy:  Appropriate abx dosing, eradication of infection.   Plan:   Zosyn 3.375g IV Q8H infused over 4hrs.  Follow up renal fxn and culture results.  Charolotte Eke, PharmD, pager [phone]. 09/15/2012,7:35 AM.

## 2012-09-15 NOTE — Evaluation (Signed)
Clinical/Bedside Swallow Evaluation Patient Details  Name: Tracy Hicks MRN: [id number] Date of Birth: 03/25/1919  Today's Date: 09/15/2012 Time: 4098-1191 SLP Time Calculation (min): 19 min  Past Medical History:  Past Medical History  Diagnosis Date  . Hypertension   . Dementia   . Hyperlipidemia   . Coronary artery disease   . STEMI (ST elevation myocardial infarction)   . Hyperlipidemia   . Stroke    Past Surgical History: History reviewed. No pertinent past surgical history. HPI:  79 y.o. female with PMH significant for dementia, CAD, CVA, history of NSTEMI 2011 treated with medical treatment, HTN who was brought to the ED by son due to generalized weakness, decrease appetite, vomiting.  Patient has being receiving treatment for UTI with ciprofloxacin for last week. Patient usually cough constantly at home for last 7 years. 1 week prior to admission , son notice strong urine odor, and increase urination. Patient was started on ciprofloxacin by PCP for presume UTI. Patient was found to have WBC at 30,000, lactic acid at 6, increase BNP 3827, troponin at 0.44,  In the ED patient started to have projectile vomit while RN was giving suppository. Chest x ray 7/25 with Mild central pulmonary vascular congestion is noted with probable mild bilateral perihilar edema. CXR repeated 7/26 showing Cardiac enlargement with pulmonary vascular congestion and perihilar edema. Increasing infiltration in the right lower lung.  Son denies pt. coughing/strangling during meals.   Assessment / Plan / Recommendation Clinical Impression  Pt. in bed with non rebreather mask with son at bedside.  Family denies coughing or strangling at home with meals and able to eat "whatever she wants."  Pharyngeal observations include suspected delayed swallow initiation, audible swallow which can be evidence of a disordered pharyngeal phase of swallow.  No cough, throat clear or wet vocal qualty, however unable to fully assess  with bedside assessment alone.  Increased respiratory needs/effort placed pt. at higher aspiration risk.  Suspect current lung infiltrate may be related to possibly aspirating vomitus yesterday.  Son stated she was laying down when she began to vomit and was choking.  Recommend a Dys 2 diet (respiratory ease of swallowing) and thin liquids, small sips straws ok, pills whole in applesauce.  Pt. must be alert to eat without increased respiratory rate when eating.  SLP will follow up.    Aspiration Risk  Moderate    Diet Recommendation Dysphagia 2 (Fine chop);Thin liquid   Liquid Administration via: Cup;Straw Medication Administration: Whole meds with puree Supervision: Full supervision/cueing for compensatory strategies Compensations: Slow rate;Small sips/bites Postural Changes and/or Swallow Maneuvers: Seated upright 90 degrees;Upright 30-60 min after meal    Other  Recommendations Oral Care Recommendations: Oral care BID   Follow Up Recommendations   (TBD)    Frequency and Duration min 2x/week  2 weeks   Pertinent Vitals/Pain Did not respond to questioning of pain    SLP Swallow Goals Patient will utilize recommended strategies during swallow to increase swallowing safety with: Moderate cueing   Swallow Study           Oral/Motor/Sensory Function Overall Oral Motor/Sensory Function:  (no deficits on observation, pt. not able to cooperate)   [place] chips: Within functional limits Presentation: Spoon   Thin Liquid Thin Liquid: Impaired Presentation: Spoon Pharyngeal  Phase Impairments: Suspected delayed Swallow (audible swallow)    Nectar Thick Nectar Thick Liquid: Not tested   Honey Thick Honey Thick Liquid: Not tested   Puree Puree: Impaired Pharyngeal Phase Impairments:  (  audible swallow)   Solid   GO    Solid: Not tested       Royce Macadamia M.Ed [employer] [phone]  09/15/2012

## 2012-09-15 NOTE — Consult Note (Signed)
PULMONARY  / CRITICAL CARE MEDICINE  Name: Tracy Hicks MRN: [id number] DOB: Jan 16, 1920    ADMISSION DATE:  09/14/2012 CONSULTATION DATE:  7/25  REFERRING MD :  Hartley Barefoot PRIMARY SERVICE: TRH  CHIEF COMPLAINT:  Concern for Sepsis    BRIEF: 79 y/o F with PMH of HTN, Dementia, HLD, CAD s/p NSTEMI with EF 45 % in 2011 (medically managed by Dr. Elsworth Soho) who is followed by Dr. Oneta Rack as PCP.  She was seen 7/19 for frequency, urgency and foul smelling urine and was treated with bactrim.  Unfortunately, began having nausea / vomiting on 7/23 and was changed to Cipro on 7/24.  She continued to have nausea / vomiting after antibiotic ingestion.  Seen at PCP office x2 on 7/24 and gave urine sample.  7/24 PM patient stayed awake most of evening, Son Leonette Most - primary caregiver) noted she also had decreased appetite and weakness.    At baseline, she walks with a walker and gets out 3x per day for a drive in the car with her son (he drives).  She uses depends for urinary incontinence.  He assists her with ADLS and food prep.    In ER, work up noted clean UA (on abx for ~1 wk pta), mild acute kidney injury, hyperglycemia, mild troponin & BNP leak, and leukocytosis with left shift.  Patient had low grade temp of 100.8, lactic acid of 6.8 and normotensive BP.  She had an episode of n/v in ER while flat for rectal suppository insertion with concern for potential aspiration.      LINES / TUBES: PIV 7/25>>> Foley 7/25>>>  CULTURES: UA 7/25>>>0-2 wbc, neg nitrite, neg leuks  ANTIBIOTICS: Cipro / Bactrim >>>as outpatient x5 days pta ........................................................................Marland Kitchen Vanco 7/25>>>7/25 Zosyn 7/25>>    SIGNIFICANT EVENTS / STUDIES:  7/25 - ADMIT She has leukocytosis, leftward shift and lactic acidosis all c/w severe sepsis. The source of sepsis is unclear. Her UA is unimpressive and recent suspected UTI is likely fully treated by abx prescribed PTA. Her  chief symptoms are all abdominal but CT notably unremarkable. It is possible that the abx caused severe N/V and the leukocytosis is simply stress demargination. Alternatively she could have mesenteric ischemia (for which a CT is not sensitive) +/- bacterial translocation. I don't see much evidence on CXR for PNA. With this in mind, I think zosyn is probably the best abx choice for now.    SUBJECTIVE:   77/14: NSTEMI per troponin. CXR with RLL infitrates c/w aspiraton.   VITAL SIGNS: Temp:  [97.1 F (36.2 C)-100.8 F (38.2 C)] 99.2 F (37.3 C) (07/26 0800) Pulse Rate:  [73-99] 73 (07/26 0400) Resp:  [20-29] 23 (07/26 0844) BP: (115-151)/(37-82) 151/43 mmHg (07/26 0844) SpO2:  [89 %-100 %] 98 % (07/26 0400) FiO2 (%):  [50 %] 50 % (07/25 1410) Weight:  [66.9 kg (147 lb 7.8 oz)-72.576 kg (160 lb)] 66.9 kg (147 lb 7.8 oz) (07/26 0500) HEMODYNAMICS:   VENTILATOR SETTINGS: Vent Mode:  [-]  FiO2 (%):  [50 %] 50 % INTAKE / OUTPUT: Intake/Output     07/25 0701 - 07/26 0700 07/26 0701 - 07/27 0700   I.V. (mL/kg) 488 (7.3) 80 (1.2)   IV Piggyback 100 12.5   Total Intake(mL/kg) 588 (8.8) 92.5 (1.4)   Urine (mL/kg/hr) 505    Total Output 505     Net +83 +92.5          PHYSICAL EXAMINATION: General:  Frail elderly female in NAD Neuro:  Sleepy but  arouses and is appropriate HEENT:  Mm pink/dry, no JVD Cardiovascular:  s1s2 rrr, no m/r/g Lungs:  resp's even/non-labored, lungs bilaterally diminished Abdomen:  Round/soft, bsx4 hypo, non-tender Musculoskeletal:  No acute deformities Skin:  Warm/dry  LABS: PULMONARY  Recent Labs Lab 09/14/12 1422  PHART 7.491*  PCO2ART 27.2*  PO2ART 105.0*  HCO3 20.6  TCO2 17.6  O2SAT 98.0    CBC  Recent Labs Lab 09/14/12 1204 09/15/12 0148  HGB 14.6 13.9  HCT 42.0 40.3  WBC 30.1* 20.2*  PLT 274 194    COAGULATION No results found for this basename: INR,  in the last 168 hours  CARDIAC   Recent Labs Lab 09/14/12 1204  09/14/12 1346 09/14/12 1929 09/15/12 0148  TROPONINI 0.44* 0.45* 1.87* 4.09*    Recent Labs Lab 09/14/12 1204  PROBNP 3827.0*     CHEMISTRY  Recent Labs Lab 09/14/12 1204 09/15/12 0148  NA 136 134*  K 3.6 3.8  CL 96 99  CO2 21 25  GLUCOSE 244* 137*  BUN 20 22  CREATININE 1.21* 1.19*  CALCIUM 9.8 8.7    LIVER  Recent Labs Lab 09/14/12 1204 09/15/12 0148  AST 23 48*  ALT 16 27  ALKPHOS 82 66  BILITOT 0.5 0.4  PROT 6.9 5.7*  ALBUMIN 3.8 2.9*     INFECTIOUS  Recent Labs Lab 09/14/12 1209 09/14/12 1346 09/14/12 1932  LATICACIDVEN 6.4* 6.8* 2.8*     ENDOCRINE CBG (last 3)  No results found for this basename: GLUCAP,  in the last 72 hours       IMAGING x48h  Ct Abdomen Pelvis Wo Contrast  09/14/2012   *RADIOLOGY REPORT*  Clinical Data: Nausea and vomiting.  Recently treated for urinary tract infection.  History of dementia.  CT ABDOMEN AND PELVIS WITHOUT CONTRAST  Technique:  Multidetector CT imaging of the abdomen and pelvis was performed following the standard protocol without intravenous contrast.  Comparison: CT 09/17/2009  Findings: Images through the lung bases demonstrate mitral annular calcifications, a small hiatal hernia and mild chronic interstitial lung disease.  Previously demonstrated pleural effusions have resolved.  The left kidney demonstrates a nonobstructing 5 mm calculus in its lower pole and cortical thinning.  There is no hydronephrosis or asymmetric perinephric soft tissue stranding.  No focal right renal abnormality is identified.  A calcification at the right renal hilum on image 26 appears vascular.  There is a stable low-density 2.0 cm left adrenal nodule on image 19, consistent with an adenoma.  The right adrenal gland appears normal.  As evaluated in the noncontrast state, the liver, gallbladder, pancreas and spleen appear unremarkable.  No enteric contrast was administered.  There is no significant bowel distension or focal  wall thickening.  Stool is present throughout the colon.  There are sigmoid colon diverticular changes.  There is diffuse aorto iliac atherosclerosis with stable mildly eccentric dilatation of the distal aorta.  No enlarged abdominal pelvic lymph nodes are identified.  There is no adnexal mass.  The uterus, ovaries and bladder appear unremarkable. There is evidence of pelvic floor relaxation with inferior protuberance of the bladder and vagina.  Convex left lumbar scoliosis and associated spondylosis are noted. No acute osseous findings are seen.  IMPRESSION:  1.  No acute findings or explanation for vomiting identified. There is no evidence of bowel obstruction. 2.  No evidence of complicated urinary tract infection or hydronephrosis. 3.  Left renal atrophy and nonobstructing calculus in the lower pole of the left kidney are  grossly stable. 4.  Pelvic floor relaxation.   Original Report Authenticated By: Carey Bullocks, M.D.   Dg Chest 2 View  09/14/2012   *RADIOLOGY REPORT*  Clinical Data: Weakness, coronary artery disease  CHEST - 2 VIEW  Comparison: September 20, 2009.  Findings: Cardiomediastinal silhouette appears normal.  No pleural effusion or pneumothorax is noted. Mild central pulmonary vascular congestion is noted with probable bilateral perihilar edema.  IMPRESSION: Mild central pulmonary vascular congestion is noted with probable mild bilateral perihilar edema.   Original Report Authenticated By: Lupita Raider.,  M.D.   Dg Chest Port 1 View  09/15/2012   *RADIOLOGY REPORT*  Clinical Data: Evaluate for infiltration.  PORTABLE CHEST - 1 VIEW  Comparison: 09/14/2012  Findings: Cardiac enlargement with mild pulmonary vascular congestion and perihilar edema.  Increasing infiltration in the right lower lung.  No definite pneumothorax or effusion. Calcification of the aorta.  IMPRESSION: Cardiac enlargement with pulmonary vascular congestion and perihilar edema.  Increasing infiltration in the right lower  lung.   Original Report Authenticated By: Burman Nieves, M.D.       ASSESSMENT / PLAN:  PULMONARY A:  Respiratory Alkalosis - in setting of sepsis ? Aspiration Event - in ER on admit.  Initial cxr clear  726/14:  Worsening acute resp failure.Tachypneic. Needing face mask. RLL infiltrate on cXR  - new. Aspn PNA. High risk for intubation. Son not sure about intubation; leaning towards short term intubation. Wants to  confer with siblings  P:   - CHECK ABG now - Face mas oxygen. BIpap if worse - Intubate if worse - short term vent should be okay (I d.w [facility] Dr Sunnie Nielsen and suggested family goals of care) -oxygen to support sats >92% -pulmonary hygiene -aspiration precautions -repeat cxr in am -PRN BD's   CARDIOVASCULAR A:  CAD Mild Troponin Leak - likely demand ischemia in setting of sepsis - ? Type 2 NSTEMI  P:  -[facility] consulted per primary (d/w [facility] - they will call [facility] again regarding rising trroponin) - check bNP -hold home anti-hypertensive regimen -cont plavix -repeat lactic acid to ensure clearance  RENAL A:   ATN - improving. Likely due to vomit and volume loss  P:   -monitor, correct underlying source -gentle hydration -insert foley, monitor I/O  GASTROINTESTINAL A:   Nausea / Vomiting - likely in setting of UTI.  CT ABD negative but lactic acidosis and high white count is concerning for potential abd ischemia on differential but would not be surgical candidate.  09/15/12: Possible DDx is drug related vomiting and volume loss resulting in lactic cacidosis  P:   -PRN zofran -NPO for now, if no further vomiting then advance to clear liquid diet as tolerated -D51/2 NS while NPO  HEMATOLOGIC A:   Leukocytosis  P:  -see ID  INFECTIOUS A:   Recent UTI  Nausea / Vomiting, abdominal pain - concern for abd sepsis Leukocytosis, left shift P:   -Micro and abx as above  ENDOCRINE A:   Hyperglycemia - no hx of DM, stress response   P:    -monitor, if persistently elevated on BMP, consider SSI  NEUROLOGIC A:   Dementia   09/15/12:  Calm but confused. Needing frequent calming down by son  P:   -supportive care  GLOBAL 09/15/12:  Son (who is 1 of 3 siblings) is at bedside and been caregiver for many years. All siblings are dpoa. He clearly said do not do cpr and do not make her suffer  but wants her to be able to go home. He is equivocal of vent support but does not understand what it means other than "life support". HE declared no cpr and as I walked out of room he turned around and said, " I cannot play god, It is my mom and I need to talk to my siblings".  D/w [facility]" Dr Sunnie Nielsen will do goals of care    The patient is critically ill with multiple organ systems failure and requires high complexity decision making for assessment and support, frequent evaluation and titration of therapies, application of advanced monitoring technologies and extensive interpretation of multiple databases.   Critical Care Time devoted to patient care services described in this note is  45  Minutes.  Dr. Kalman Shan, M.D., [facility].C.P Pulmonary and Critical Care Medicine Staff Physician [facility] [place] [facility] Pager: [phone], If no answer or between  15:00h - 7:00h: call [phone]  09/15/2012 10:27 AM

## 2012-09-16 DIAGNOSIS — I059 Rheumatic mitral valve disease, unspecified: Secondary | ICD-10-CM

## 2012-09-16 DIAGNOSIS — J9600 Acute respiratory failure, unspecified whether with hypoxia or hypercapnia: Secondary | ICD-10-CM

## 2012-09-16 DIAGNOSIS — R404 Transient alteration of awareness: Secondary | ICD-10-CM

## 2012-09-16 DIAGNOSIS — N179 Acute kidney failure, unspecified: Secondary | ICD-10-CM

## 2012-09-16 LAB — CBC
MCH: 30.3 pg (ref 26.0–34.0)
MCV: 89.2 fL (ref 78.0–100.0)
Platelets: 176 10*3/uL (ref 150–400)
RDW: 14 % (ref 11.5–15.5)
WBC: 13.4 10*3/uL — ABNORMAL HIGH (ref 4.0–10.5)

## 2012-09-16 LAB — URINE CULTURE
Colony Count: NO GROWTH
Culture: NO GROWTH

## 2012-09-16 LAB — BLOOD GAS, ARTERIAL
Acid-Base Excess: 0.1 mmol/L (ref 0.0–2.0)
Drawn by: 31288
FIO2: 1 %
pCO2 arterial: 33.1 mmHg — ABNORMAL LOW (ref 35.0–45.0)
pH, Arterial: 7.455 — ABNORMAL HIGH (ref 7.350–7.450)

## 2012-09-16 LAB — LIPASE, BLOOD: Lipase: 22 U/L (ref 11–59)

## 2012-09-16 LAB — BASIC METABOLIC PANEL
CO2: 26 mEq/L (ref 19–32)
Chloride: 101 mEq/L (ref 96–112)
GFR calc non Af Amer: 34 mL/min — ABNORMAL LOW (ref >90)
Glucose, Bld: 131 mg/dL — ABNORMAL HIGH (ref 70–99)
Potassium: 3.3 mEq/L — ABNORMAL LOW (ref 3.5–5.1)
Sodium: 136 mEq/L (ref 135–145)

## 2012-09-16 MED ORDER — POTASSIUM CHLORIDE 20 MEQ PO PACK
40.0000 meq | PACK | Freq: Once | ORAL | Status: DC
  Filled 2012-09-16: qty 2

## 2012-09-16 MED ORDER — FUROSEMIDE 10 MG/ML IJ SOLN
20.0000 mg | Freq: Two times a day (BID) | INTRAMUSCULAR | Status: DC
  Administered 2012-09-17: 20 mg via INTRAVENOUS
  Filled 2012-09-16 (×4): qty 2

## 2012-09-16 MED ORDER — FUROSEMIDE 10 MG/ML IJ SOLN
INTRAMUSCULAR | Status: AC
  Administered 2012-09-16: 20 mg via INTRAVENOUS
  Filled 2012-09-16: qty 4

## 2012-09-16 MED ORDER — BIOTENE DRY MOUTH MT LIQD
15.0000 mL | Freq: Two times a day (BID) | OROMUCOSAL | Status: DC
  Administered 2012-09-16 – 2012-09-22 (×12): 15 mL via OROMUCOSAL

## 2012-09-16 MED ORDER — CHLORHEXIDINE GLUCONATE 0.12 % MT SOLN
15.0000 mL | Freq: Two times a day (BID) | OROMUCOSAL | Status: DC
  Administered 2012-09-16 – 2012-09-17 (×4): 15 mL via OROMUCOSAL
  Filled 2012-09-16 (×6): qty 15

## 2012-09-16 MED ORDER — POTASSIUM CHLORIDE 10 MEQ/100ML IV SOLN
10.0000 meq | INTRAVENOUS | Status: AC
  Administered 2012-09-16 (×2): 10 meq via INTRAVENOUS
  Filled 2012-09-16: qty 200

## 2012-09-16 NOTE — Progress Notes (Signed)
Pt. Seen and examined. Agree with the NP/PA-C note as written.  EF appears preserved without wall motion abnormalities. Enyzmes may be related to sepsis. While this is prognostically significant, it will not change management at this time. Continue medical therapy.  Chrystie Nose, MD, [facility] Attending Cardiologist [facility]

## 2012-09-16 NOTE — Consult Note (Signed)
PULMONARY  / CRITICAL CARE MEDICINE  Name: Tracy Hicks MRN: [id number] DOB: [birth year]    ADMISSION DATE:  09/14/2012 CONSULTATION DATE:  7/25  REFERRING MD :  Hartley Barefoot PRIMARY SERVICE: TRH  CHIEF COMPLAINT:  Concern for Sepsis    BRIEF: 79 y/o F with PMH of HTN, Dementia, HLD, CAD s/p NSTEMI with EF 45 % in 2011 (medically managed by Dr. Elsworth Soho) who is followed by Dr. Oneta Rack as PCP.  She was seen 7/19 for frequency, urgency and foul smelling urine and was treated with bactrim.  Unfortunately, began having nausea / vomiting on 7/23 and was changed to Cipro on 7/24.  She continued to have nausea / vomiting after antibiotic ingestion.  Seen at PCP office x2 on 7/24 and gave urine sample.  7/24 PM patient stayed awake most of evening, Son Leonette Most - primary caregiver) noted she also had decreased appetite and weakness.    At baseline, she walks with a walker and gets out 3x per day for a drive in the car with her son (he drives).  She uses depends for urinary incontinence.  He assists her with ADLS and food prep.    In ER, work up noted clean UA (on abx for ~1 wk pta), mild acute kidney injury, hyperglycemia, mild troponin & BNP leak, and leukocytosis with left shift.  Patient had low grade temp of 100.8, lactic acid of 6.8 and normotensive BP.  She had an episode of n/v in ER while flat for rectal suppository insertion with concern for potential aspiration.      LINES / TUBES: PIV 7/25>>> Foley 7/25>>>  CULTURES: UA 7/25>>>0-2 wbc, neg nitrite, neg leuks. NO GROWTH    ANTIBIOTICS: Cipro / Bactrim >>>as outpatient x5 days pta ........................................................................Marland Kitchen Vanco 7/25>>>7/25 Zosyn 7/25>>       SIGNIFICANT EVENTS / STUDIES:  7/25 - ADMIT She has leukocytosis, leftward shift and lactic acidosis all c/w severe sepsis. The source of sepsis is unclear. Her UA is unimpressive and recent suspected UTI is likely fully treated by abx  prescribed PTA. Her chief symptoms are all abdominal but CT notably unremarkable. It is possible that the abx caused severe N/V and the leukocytosis is simply stress demargination. Alternatively she could have mesenteric ischemia (for which a CT is not sensitive) +/- bacterial translocation. I don't see much evidence on CXR for PNA. With this in mind, I think zosyn is probably the best abx choice for now.   09/15/12: NSTEMI per troponin. Cards restarted plavix and advised med mgmt.  Plan tostart BB and nitrates later. CXR with RLL infitrates c/w aspiraton.  NO CPR but undecided on intubation    SUBJECTIVE/OVERNIGHT/INTERVAL HX  09/16/12: berathing better but creat worse. BNP 8K    VITAL SIGNS: Temp:  [97.1 F (36.2 C)-100.8 F (38.2 C)] 97.1 F (36.2 C) (07/27 0800) Pulse Rate:  [30-85] 56 (07/27 0500) Resp:  [22-30] 23 (07/27 0500) BP: (105-157)/(28-83) 129/28 mmHg (07/27 0500) SpO2:  [86 %-99 %] 99 % (07/27 0500) FiO2 (%):  [50 %] 50 % (07/26 1100) HEMODYNAMICS:   VENTILATOR SETTINGS: Vent Mode:  [-]  FiO2 (%):  [50 %] 50 % INTAKE / OUTPUT: Intake/Output     07/26 0701 - 07/27 0700 07/27 0701 - 07/28 0700   I.V. (mL/kg) 960 (14.3) 80 (1.2)   IV Piggyback 150 12.5   Total Intake(mL/kg) 1110 (16.6) 92.5 (1.4)   Urine (mL/kg/hr) 960 (0.6) 60 (0.3)   Total Output 960 60   Net +150 +32.5  PHYSICAL EXAMINATION: General:  Frail elderly female in NAD Neuro:  Sleepy but arouses and is appropriate HEENT:  Mm pink/dry, no JVD Cardiovascular:  s1s2 rrr, no m/r/g Lungs:  resp's even/non-labored, lungs bilaterally diminished Abdomen:  Round/soft, bsx4 hypo, non-tender Musculoskeletal:  No acute deformities Skin:  Warm/dry  LABS: PULMONARY  Recent Labs Lab 09/14/12 1422 09/15/12 1012 09/16/12 0402  PHART 7.491* 7.506* 7.455*  PCO2ART 27.2* 28.3* 33.1*  PO2ART 105.0* 51.4* 77.5*  HCO3 20.6 22.2 22.8  TCO2 17.6 19.2 19.9  O2SAT 98.0 88.8 95.6    CBC  Recent  Labs Lab 09/14/12 1204 09/15/12 0148 09/16/12 0343  HGB 14.6 13.9 14.1  HCT 42.0 40.3 41.5  WBC 30.1* 20.2* 13.4*  PLT 274 194 176    COAGULATION No results found for this basename: INR,  in the last 168 hours  CARDIAC    Recent Labs Lab 09/14/12 1204 09/14/12 1346 09/14/12 1929 09/15/12 0148  TROPONINI 0.44* 0.45* 1.87* 4.09*    Recent Labs Lab 09/14/12 1204 09/16/12 0343  PROBNP 3827.0* 8809.0*     CHEMISTRY  Recent Labs Lab 09/14/12 1204 09/15/12 0148 09/16/12 0343  NA 136 134* 136  K 3.6 3.8 3.3*  CL 96 99 101  CO2 21 25 26   GLUCOSE 244* 137* 131*  BUN 20 22 26*  CREATININE 1.21* 1.19* 1.32*  CALCIUM 9.8 8.7 8.7  MG  --   --  2.0  PHOS  --   --  2.7    LIVER  Recent Labs Lab 09/14/12 1204 09/15/12 0148  AST 23 48*  ALT 16 27  ALKPHOS 82 66  BILITOT 0.5 0.4  PROT 6.9 5.7*  ALBUMIN 3.8 2.9*     INFECTIOUS  Recent Labs Lab 09/14/12 1346 09/14/12 1932 09/15/12 0148 09/16/12 0343  LATICACIDVEN 6.8* 2.8*  --  1.4  PROCALCITON  --   --  0.30 0.51     ENDOCRINE CBG (last 3)  No results found for this basename: GLUCAP,  in the last 72 hours       IMAGING x48h  Ct Abdomen Pelvis Wo Contrast  09/14/2012   *RADIOLOGY REPORT*  Clinical Data: Nausea and vomiting.  Recently treated for urinary tract infection.  History of dementia.  CT ABDOMEN AND PELVIS WITHOUT CONTRAST  Technique:  Multidetector CT imaging of the abdomen and pelvis was performed following the standard protocol without intravenous contrast.  Comparison: CT 09/17/2009  Findings: Images through the lung bases demonstrate mitral annular calcifications, a small hiatal hernia and mild chronic interstitial lung disease.  Previously demonstrated pleural effusions have resolved.  The left kidney demonstrates a nonobstructing 5 mm calculus in its lower pole and cortical thinning.  There is no hydronephrosis or asymmetric perinephric soft tissue stranding.  No focal right renal  abnormality is identified.  A calcification at the right renal hilum on image 26 appears vascular.  There is a stable low-density 2.0 cm left adrenal nodule on image 19, consistent with an adenoma.  The right adrenal gland appears normal.  As evaluated in the noncontrast state, the liver, gallbladder, pancreas and spleen appear unremarkable.  No enteric contrast was administered.  There is no significant bowel distension or focal wall thickening.  Stool is present throughout the colon.  There are sigmoid colon diverticular changes.  There is diffuse aorto iliac atherosclerosis with stable mildly eccentric dilatation of the distal aorta.  No enlarged abdominal pelvic lymph nodes are identified.  There is no adnexal mass.  The uterus, ovaries and  bladder appear unremarkable. There is evidence of pelvic floor relaxation with inferior protuberance of the bladder and vagina.  Convex left lumbar scoliosis and associated spondylosis are noted. No acute osseous findings are seen.  IMPRESSION:  1.  No acute findings or explanation for vomiting identified. There is no evidence of bowel obstruction. 2.  No evidence of complicated urinary tract infection or hydronephrosis. 3.  Left renal atrophy and nonobstructing calculus in the lower pole of the left kidney are grossly stable. 4.  Pelvic floor relaxation.   Original Report Authenticated By: Carey Bullocks, M.D.   Dg Chest 2 View  09/14/2012   *RADIOLOGY REPORT*  Clinical Data: Weakness, coronary artery disease  CHEST - 2 VIEW  Comparison: September 20, 2009.  Findings: Cardiomediastinal silhouette appears normal.  No pleural effusion or pneumothorax is noted. Mild central pulmonary vascular congestion is noted with probable bilateral perihilar edema.  IMPRESSION: Mild central pulmonary vascular congestion is noted with probable mild bilateral perihilar edema.   Original Report Authenticated By: Lupita Raider.,  M.D.   Dg Chest Port 1 View  09/15/2012   *RADIOLOGY REPORT*   Clinical Data: Evaluate for infiltration.  PORTABLE CHEST - 1 VIEW  Comparison: 09/14/2012  Findings: Cardiac enlargement with mild pulmonary vascular congestion and perihilar edema.  Increasing infiltration in the right lower lung.  No definite pneumothorax or effusion. Calcification of the aorta.  IMPRESSION: Cardiac enlargement with pulmonary vascular congestion and perihilar edema.  Increasing infiltration in the right lower lung.   Original Report Authenticated By: Burman Nieves, M.D.       ASSESSMENT / PLAN:  PULMONARY A:  Respiratory Alkalosis - in setting of sepsis ? Aspiration Event - in ER on admit.  Initial cxr clear  09/16/12: Imprioing resp status  P -- Face mas oxygen. BIpap if worse - Intubate baed on discretion of MD (see family conference below) -oxygen to support sats >92% -pulmonary hygiene -aspiration precautions -repeat cxr in am -PRN BD's   CARDIOVASCULAR A:  CAD  09/15/12: Diagnosed as NSTEMI 09/16/12: Appears to be in ? Acute on chronic systolic CHF  P:  -per [facility]  RENAL A:    09/15/12: ATN - improving. Likely due to vomit and volume loss 09/16/12: getting worse. BNP high  P:   - Lasix Rx -monitor, correct underlying source -make fluid KVO -insert foley, monitor I/O  GASTROINTESTINAL A:   Nausea / Vomiting - likely in setting of UTI.  CT ABD negative but lactic acidosis and high white count is concerning for potential abd ischemia on differential but would not be surgical candidate.  09/15/12: Possible DDx is drug related vomiting and volume loss resulting in lactic cacidosis  09/16/12: D2 diet started by [facility]  P:   -d2 diet based on RN sueprvision    HEMATOLOGIC A:   Leukocytosis  P:  -see ID  INFECTIOUS A:   Recent UTI  Nausea / Vomiting, abdominal pain - concern for abd sepsis Leukocytosis, left shift P:   -Micro and abx as above  ENDOCRINE A:   Hyperglycemia - no hx of DM, stress response   P:   -monitor, if  persistently elevated on BMP, consider SSI  NEUROLOGIC A:   Dementia   09/16/12:  Calm but confused and sleepy most of the time. Needing frequent calming down by son. Improved  P:   -supportive care  GLOBAL 09/15/12:  Son (who is 1 of 3 siblings) is at bedside and been caregiver for many years. All siblings are  dpoa. He clearly said do not do cpr and do not make her suffer but wants her to be able to go home. He is equivocal of vent support but does not understand what it means other than "life support". HE declared no cpr and as I walked out of room he turned around and said, " I cannot play god, It is my mom and I need to talk to my siblings".  D/w [facility]" Dr Sunnie Nielsen will do goals of care   09/16/12: Long 45 min family meet with daughter Georgeann Oppenheim and son Billey Gosling (who is the bedside caregiver for many years). Definitely no CPR. Patient has been recently saying that she wants to go to [place] asap to be with her parents. Son Billey Gosling feels intubation will interfere with this process and will hurt her physically. HE is ok wthbipap and prefers mom to die at home with home hospice. He does not want intubation but does not want to say that for fear of playing god. ONe other son who did not show up apparenty is for short term intuation. Daugther  daughter Georgeann Oppenheim "want to do everything possible short of being ridiculous". Family struggling with this decision. Definitely if at all only short term ventilation but if MD stays time to palliate they are fine with it. Therefore,  Intubation left to discretion of MD based on clinical situation at that point.  Therefore, NO CPR. NO DEFIB. YES for BIPAP. YES for pressors. YEs for Meds. Goal is functional recovery. Intubation to be decided by MD/RN depending on status. Definitely NO SNF, NO LTAC, NO TRACH  Move to PCCM service; they want single team in care     The patient is critically ill with multiple organ systems failure and requires high complexity decision  making for assessment and support, frequent evaluation and titration of therapies, application of advanced monitoring technologies and extensive interpretation of multiple databases.   Critical Care Time devoted to patient care services described in this note is  105  Minutes including family conversation time.  Dr. Kalman Shan, M.D., [facility].C.P Pulmonary and Critical Care Medicine Staff Physician [facility] [place] [facility] Pager: [phone], If no answer or between  15:00h - 7:00h: call [phone]  09/16/2012 10:12 AM

## 2012-09-16 NOTE — Progress Notes (Signed)
[facility] PROGRESS NOTE  Tracy Hicks ZOX:[id number] DOB: [birth year] DOA: 09/14/2012 PCP: Nadean Corwin, MD  Assessment/Plan:  1-Sepsis: patient presents with lactic acidosis, lactic acid at 6, leukocytosis, fever. Source of infection could be aspiration PNA, Vs urine infection. Blood culture pending. Patient started on Broad spectrum antibiotics, vancomycin and Zosyn 7-25. Vancomycin was discontinue,. Continue with Zosyn 3 . Follow urine culture.  Lactic acid decrease to 1.4.  2-NSTEMI: in setting of sepsis, heart failure.  resume aspirin, plavix when taking oral. Appreciate cardiologist assistance. Monitor BP on metoprolol. Troponin increase to 4.   3-Acute respiratory failure: Patient likely aspirated in the ED.   nebulizer treatment.  Chest x ray with increase infiltration right lower lobe. Continue with Zosyn to treat for aspiration PNA. 02 increase to 77 on ABG. Will try to titrate oxygen. Family still thinking about short term intubation.   4-Mild renal insufficiency: in setting of infection, vomiting, heart failure. Cr increase to 1.3. Will increase IV fluids to 50 cc/hour.   5-Nausea vomiting: Patient had a CT scan that show no acute finding. LFT normal. Resolved. Continue with protonix.   6-Systolic Heart Failure  chronic: Patient presents with increase BNP, chest x ray with Mild central pulmonary vascular congestion is noted with probable mild bilateral perihilar edema. Will hold on lasix at this time due to concern for Sepsis. Cardiologist following.  7-Aspiration PNA: Chest x ray with increase infiltrates right lower lobe. Aspiration episode in the ED. Patient notice by family to have problems with upper respiratory secretion at home and chronic cough. Suspect she has been aspirating at home also. Speech therapy consulted. Continue with Zosyn.   DVT prophylaxis:  Lovenox. GI prophylaxis: protonix.    Code Status: Full Code.  Family Communication: Care discussed  with Daughter who was at bedside. Family has decide for no Chest Compression. They are still thinking about short term intubation. Dr Marchelle Gearing to speak with them about short term intubation.  Disposition Plan: Remain step down.    Consultants:  CCM  Cardiologist  Procedures:  none  Antibiotics:  Zosyn 7-25  Received one dose of Ceftriaxone.   HPI/Subjective: Patient calm, eyes close, answer some questions. Denies SO.  Had fever overnight, no worsening respiratory status.   Objective: Filed Vitals:   09/15/12 2257 09/16/12 0000 09/16/12 0400 09/16/12 0500  BP: 157/51 140/48  129/28  Pulse: 85 63  56  Temp:  100.8 F (38.2 C) 99.3 F (37.4 C)   TempSrc:  Axillary Axillary   Resp:  26  23  Height:      Weight:      SpO2:  98%  99%    Intake/Output Summary (Last 24 hours) at 09/16/12 0807 Last data filed at 09/16/12 0700  Gross per 24 hour  Intake   1070 ml  Output    960 ml  Net    110 ml   Filed Weights   09/14/12 1428 09/14/12 1520 09/15/12 0500  Weight: 72.576 kg (160 lb) 67.2 kg (148 lb 2.4 oz) 66.9 kg (147 lb 7.8 oz)    Exam:   General: No distress, eyes close, answer some question.   Cardiovascular: S 1, S 2 RRR  Respiratory: No wheezes, sporadic ronchus.   Abdomen: BS present, soft, NT  Musculoskeletal: No edema.   Data Reviewed: Basic Metabolic Panel:  Recent Labs Lab 09/14/12 1204 09/15/12 0148 09/16/12 0343  NA 136 134* 136  K 3.6 3.8 3.3*  CL 96 99 101  CO2 21 25 26  GLUCOSE 244* 137* 131*  BUN 20 22 26*  CREATININE 1.21* 1.19* 1.32*  CALCIUM 9.8 8.7 8.7  MG  --   --  2.0  PHOS  --   --  2.7   Liver Function Tests:  Recent Labs Lab 09/14/12 1204 09/15/12 0148  AST 23 48*  ALT 16 27  ALKPHOS 82 66  BILITOT 0.5 0.4  PROT 6.9 5.7*  ALBUMIN 3.8 2.9*    Recent Labs Lab 09/14/12 1204 09/16/12 0343  LIPASE 15 22   No results found for this basename: AMMONIA,  in the last 168 hours CBC:  Recent Labs Lab  09/14/12 1204 09/15/12 0148 09/16/12 0343  WBC 30.1* 20.2* 13.4*  NEUTROABS 29.1*  --   --   HGB 14.6 13.9 14.1  HCT 42.0 40.3 41.5  MCV 87.5 87.2 89.2  PLT 274 194 176   Cardiac Enzymes:  Recent Labs Lab 09/14/12 1204 09/14/12 1346 09/14/12 1929 09/15/12 0148  TROPONINI 0.44* 0.45* 1.87* 4.09*   BNP (last 3 results)  Recent Labs  09/14/12 1204 09/16/12 0343  PROBNP 3827.0* 8809.0*   CBG: No results found for this basename: GLUCAP,  in the last 168 hours  Recent Results (from the past 240 hour(s))  MRSA PCR SCREENING     Status: None   Collection Time    09/14/12  3:20 PM      Result Value Range Status   MRSA by PCR NEGATIVE  NEGATIVE Final   Comment:            The GeneXpert MRSA Assay (FDA     approved for NASAL specimens     only), is one component of a     comprehensive MRSA colonization     surveillance program. It is not     intended to diagnose MRSA     infection nor to guide or     monitor treatment for     MRSA infections.     Studies: Ct Abdomen Pelvis Wo Contrast  09/14/2012   *RADIOLOGY REPORT*  Clinical Data: Nausea and vomiting.  Recently treated for urinary tract infection.  History of dementia.  CT ABDOMEN AND PELVIS WITHOUT CONTRAST  Technique:  Multidetector CT imaging of the abdomen and pelvis was performed following the standard protocol without intravenous contrast.  Comparison: CT 09/17/2009  Findings: Images through the lung bases demonstrate mitral annular calcifications, a small hiatal hernia and mild chronic interstitial lung disease.  Previously demonstrated pleural effusions have resolved.  The left kidney demonstrates a nonobstructing 5 mm calculus in its lower pole and cortical thinning.  There is no hydronephrosis or asymmetric perinephric soft tissue stranding.  No focal right renal abnormality is identified.  A calcification at the right renal hilum on image 26 appears vascular.  There is a stable low-density 2.0 cm left adrenal  nodule on image 19, consistent with an adenoma.  The right adrenal gland appears normal.  As evaluated in the noncontrast state, the liver, gallbladder, pancreas and spleen appear unremarkable.  No enteric contrast was administered.  There is no significant bowel distension or focal wall thickening.  Stool is present throughout the colon.  There are sigmoid colon diverticular changes.  There is diffuse aorto iliac atherosclerosis with stable mildly eccentric dilatation of the distal aorta.  No enlarged abdominal pelvic lymph nodes are identified.  There is no adnexal mass.  The uterus, ovaries and bladder appear unremarkable. There is evidence of pelvic floor relaxation with inferior protuberance of the bladder  and vagina.  Convex left lumbar scoliosis and associated spondylosis are noted. No acute osseous findings are seen.  IMPRESSION:  1.  No acute findings or explanation for vomiting identified. There is no evidence of bowel obstruction. 2.  No evidence of complicated urinary tract infection or hydronephrosis. 3.  Left renal atrophy and nonobstructing calculus in the lower pole of the left kidney are grossly stable. 4.  Pelvic floor relaxation.   Original Report Authenticated By: Carey Bullocks, M.D.   Dg Chest 2 View  09/14/2012   *RADIOLOGY REPORT*  Clinical Data: Weakness, coronary artery disease  CHEST - 2 VIEW  Comparison: September 20, 2009.  Findings: Cardiomediastinal silhouette appears normal.  No pleural effusion or pneumothorax is noted. Mild central pulmonary vascular congestion is noted with probable bilateral perihilar edema.  IMPRESSION: Mild central pulmonary vascular congestion is noted with probable mild bilateral perihilar edema.   Original Report Authenticated By: Lupita Raider.,  M.D.   Dg Chest Port 1 View  09/15/2012   *RADIOLOGY REPORT*  Clinical Data: Evaluate for infiltration.  PORTABLE CHEST - 1 VIEW  Comparison: 09/14/2012  Findings: Cardiac enlargement with mild pulmonary vascular  congestion and perihilar edema.  Increasing infiltration in the right lower lung.  No definite pneumothorax or effusion. Calcification of the aorta.  IMPRESSION: Cardiac enlargement with pulmonary vascular congestion and perihilar edema.  Increasing infiltration in the right lower lung.   Original Report Authenticated By: Burman Nieves, M.D.    Scheduled Meds: . clopidogrel  75 mg Oral Q supper  . enoxaparin (LOVENOX) injection  30 mg Subcutaneous Q24H  . levothyroxine  88 mcg Oral QAC breakfast  . metoprolol tartrate  12.5 mg Oral BID  . pantoprazole (PROTONIX) IV  40 mg Intravenous Q24H  . piperacillin-tazobactam (ZOSYN)  IV  3.375 g Intravenous Q8H  . potassium chloride  40 mEq Oral Once  . sodium chloride  3 mL Intravenous Q12H   Continuous Infusions: . dextrose 5 % and 0.45% NaCl 40 mL/hr at 09/14/12 1831    Active Problems:   Sepsis   PNA (pneumonia)   Systolic CHF   Troponin level elevated   Severe sepsis(995.92)   Lactic acid acidosis   Abdominal pain   Acute respiratory failure with hypoxia   Delirium   Acute renal failure    Time spent: 35 minutes.     Keiasha Diep  [facility] Pager [phone]. If 7PM-7AM, please contact night-coverage at [email or web address], password [facility] 09/16/2012, 8:07 AM  LOS: 2 days

## 2012-09-16 NOTE — Progress Notes (Signed)
  Echocardiogram 2D Echocardiogram has been performed.  Tracy Hicks M 09/16/2012, 11:02 AM

## 2012-09-16 NOTE — Progress Notes (Signed)
[facility]  Subjective: Resting well. No chest pain.   Objective: Vital signs in last 24 hours: Temp:  [97.1 F (36.2 C)-100.8 F (38.2 C)] 98.3 F (36.8 C) (07/27 1200) Pulse Rate:  [30-102] 95 (07/27 1356) Resp:  [19-30] 28 (07/27 1355) BP: (108-188)/(28-83) 188/59 mmHg (07/27 1356) SpO2:  [86 %-100 %] 93 % (07/27 1355) FiO2 (%):  [40 %-100 %] 40 % (07/27 1355)    Intake/Output from previous day: 07/26 0701 - 07/27 0700 In: 1110 [I.V.:960; IV Piggyback:150] Out: 960 [Urine:960] Intake/Output this shift: Total I/O In: 282.2 [I.V.:232.2; IV Piggyback:50] Out: 935 [Urine:935]  Medications Current Facility-Administered Medications  Medication Dose Route Frequency Provider Last Rate Last Dose  . 0.9 %  sodium chloride infusion  250 mL Intravenous PRN Belkys A Regalado, MD   250 mL at 09/14/12 1612  . acetaminophen (TYLENOL) tablet 650 mg  650 mg Oral Q6H PRN Belkys A Regalado, MD   650 mg at 09/16/12 1350   Or  . acetaminophen (TYLENOL) suppository 650 mg  650 mg Rectal Q6H PRN Belkys A Regalado, MD   650 mg at 09/16/12 0034  . albuterol (PROVENTIL) (5 MG/ML) 0.5% nebulizer solution 2.5 mg  2.5 mg Nebulization Q4H PRN Jeanella Craze, NP      . antiseptic oral rinse (BIOTENE) solution 15 mL  15 mL Mouth Rinse q12n4p Belkys A Regalado, MD   15 mL at 09/16/12 1531  . chlorhexidine (PERIDEX) 0.12 % solution 15 mL  15 mL Mouth Rinse BID Belkys A Regalado, MD   15 mL at 09/16/12 1142  . clopidogrel (PLAVIX) tablet 75 mg  75 mg Oral Q supper Belkys A Regalado, MD   75 mg at 09/15/12 1713  . dextrose 5 %-0.45 % sodium chloride infusion   Intravenous Continuous Kalman Shan, MD 10 mL/hr at 09/16/12 1134    . enoxaparin (LOVENOX) injection 30 mg  30 mg Subcutaneous Q24H Belkys A Regalado, MD   30 mg at 09/16/12 1029  . furosemide (LASIX) injection 20 mg  20 mg Intravenous BID Kalman Shan, MD   20 mg at 09/16/12 1135  . levothyroxine (SYNTHROID,  LEVOTHROID) tablet 88 mcg  88 mcg Oral QAC breakfast Belkys A Regalado, MD   88 mcg at 09/16/12 1356  . metoprolol tartrate (LOPRESSOR) tablet 12.5 mg  12.5 mg Oral BID Jinger Neighbors, NP   12.5 mg at 09/16/12 1356  . ondansetron (ZOFRAN) tablet 4 mg  4 mg Oral Q6H PRN Belkys A Regalado, MD       Or  . ondansetron (ZOFRAN) injection 4 mg  4 mg Intravenous Q6H PRN Belkys A Regalado, MD      . pantoprazole (PROTONIX) injection 40 mg  40 mg Intravenous Q24H Belkys A Regalado, MD   40 mg at 09/16/12 1359  . piperacillin-tazobactam (ZOSYN) IVPB 3.375 g  3.375 g Intravenous Q8H Belkys A Regalado, MD   3.375 g at 09/16/12 1532  . potassium chloride 10 mEq in 100 mL IVPB  10 mEq Intravenous Q1 Hr x 2 Kalman Shan, MD   10 mEq at 09/16/12 1530  . sodium chloride 0.9 % injection 3 mL  3 mL Intravenous Q12H Belkys A Regalado, MD   3 mL at 09/15/12 2258  . sodium chloride 0.9 % injection 3 mL  3 mL Intravenous PRN Belkys A Regalado, MD        PE: General appearance: alert, cooperative and no distress Lungs: decreased BS bilaterally Heart: regular  rate and rhythm Extremities: no LEE Pulses: 2+ and symmetric Skin: warm and dry Neurologic: Grossly normal  Lab Results:   Recent Labs  09/14/12 1204 09/15/12 0148 09/16/12 0343  WBC 30.1* 20.2* 13.4*  HGB 14.6 13.9 14.1  HCT 42.0 40.3 41.5  PLT 274 194 176   BMET  Recent Labs  09/14/12 1204 09/15/12 0148 09/16/12 0343  NA 136 134* 136  K 3.6 3.8 3.3*  CL 96 99 101  CO2 21 25 26   GLUCOSE 244* 137* 131*  BUN 20 22 26*  CREATININE 1.21* 1.19* 1.32*  CALCIUM 9.8 8.7 8.7   Cardiac Panel (last 3 results)  Recent Labs  09/14/12 1346 09/14/12 1929 09/15/12 0148  TROPONINI 0.45* 1.87* 4.09*    STUDIES  2D echo Study Conclusions  - Left ventricle: The cavity size was moderately reduced. There was mild to moderate concentric hypertrophy. Systolic function was vigorous. The estimated ejection fraction was in the range of 65% to  70%. Wall motion was normal; there were no regional wall motion abnormalities. - Aortic valve: Valve area: 2.67cm^2(VTI). Valve area: 2.81cm^2 (Vmax). - Mitral valve: Moderately calcified annulus. Mild regurgitation. Valve area by continuity equation (using LVOT flow): 3.78cm^2. - Left atrium: The atrium was mildly to moderately dilated. - Right ventricle: The cavity size was normal. Wall thickness was moderately increased. - Right atrium: The atrium was mildly dilated. - Atrial septum: No defect or patent foramen ovale was identified. Transthoracic echocardiography. M-mode, complete 2D, spectral Doppler, and color Doppler. Height: Height: 160cm. Height: 63in. Weight: Weight: 66.7kg. Weight: 146.7lb. Body mass index: BMI: 26kg/m^2. Body surface area: BSA: 1.35m^2. Blood pressure: 129/28. Patient status: Inpatient. Location: Bedside.   Assessment/Plan  Active Problems:   Sepsis   PNA (pneumonia)   Systolic CHF   Troponin level elevated   Severe sepsis(995.92)   Lactic acid acidosis   Abdominal pain   Acute respiratory failure with hypoxia   Delirium   Acute renal failure  Plan: Pt continues without chest pain.  A 2D echo today revealed vigorous systolic function with an EF of 65-70%. No WMA. Will continue with medical therapy for NSTEMI. Continue on Plavix and Lopressor. HR and BP both stable. She is hypokalemic with K+ of 3.3. She is receiving IV K. We will continue to follow.    LOS: 2 days    Jacquees Gongora M. Delmer Islam 09/16/2012 3:37 PM

## 2012-09-17 ENCOUNTER — Inpatient Hospital Stay (HOSPITAL_COMMUNITY): Payer: Medicare Other

## 2012-09-17 DIAGNOSIS — J9600 Acute respiratory failure, unspecified whether with hypoxia or hypercapnia: Secondary | ICD-10-CM

## 2012-09-17 DIAGNOSIS — I509 Heart failure, unspecified: Secondary | ICD-10-CM

## 2012-09-17 LAB — BASIC METABOLIC PANEL
CO2: 27 mEq/L (ref 19–32)
Calcium: 9.3 mg/dL (ref 8.4–10.5)
Creatinine, Ser: 1.17 mg/dL — ABNORMAL HIGH (ref 0.50–1.10)
Glucose, Bld: 105 mg/dL — ABNORMAL HIGH (ref 70–99)

## 2012-09-17 LAB — CBC
MCH: 29.7 pg (ref 26.0–34.0)
MCV: 87.4 fL (ref 78.0–100.0)
Platelets: 164 10*3/uL (ref 150–400)
RDW: 13.6 % (ref 11.5–15.5)

## 2012-09-17 LAB — PROCALCITONIN: Procalcitonin: 0.36 ng/mL

## 2012-09-17 LAB — BLOOD GAS, ARTERIAL
Bicarbonate: 20.6 mEq/L (ref 20.0–24.0)
Patient temperature: 98.6
pH, Arterial: 7.491 — ABNORMAL HIGH (ref 7.350–7.450)
pO2, Arterial: 105 mmHg — ABNORMAL HIGH (ref 80.0–100.0)

## 2012-09-17 LAB — PHOSPHORUS: Phosphorus: 2 mg/dL — ABNORMAL LOW (ref 2.3–4.6)

## 2012-09-17 MED ORDER — METOPROLOL TARTRATE 1 MG/ML IV SOLN
2.5000 mg | INTRAVENOUS | Status: DC | PRN
  Administered 2012-09-17: 5 mg via INTRAVENOUS
  Filled 2012-09-17: qty 5

## 2012-09-17 MED ORDER — FUROSEMIDE 10 MG/ML IJ SOLN
20.0000 mg | Freq: Every day | INTRAMUSCULAR | Status: DC
  Administered 2012-09-17 – 2012-09-19 (×3): 20 mg via INTRAVENOUS
  Filled 2012-09-17 (×2): qty 2

## 2012-09-17 MED ORDER — SODIUM PHOSPHATE 3 MMOLE/ML IV SOLN
20.0000 mmol | Freq: Once | INTRAVENOUS | Status: AC
  Administered 2012-09-17: 20 mmol via INTRAVENOUS
  Filled 2012-09-17: qty 6.67

## 2012-09-17 MED ORDER — POTASSIUM CHLORIDE CRYS ER 20 MEQ PO TBCR
40.0000 meq | EXTENDED_RELEASE_TABLET | ORAL | Status: AC
  Administered 2012-09-17 (×2): 40 meq via ORAL
  Filled 2012-09-17 (×2): qty 2

## 2012-09-17 MED ORDER — METOPROLOL TARTRATE 50 MG PO TABS
50.0000 mg | ORAL_TABLET | Freq: Two times a day (BID) | ORAL | Status: DC
  Administered 2012-09-17 – 2012-09-19 (×3): 50 mg via ORAL
  Filled 2012-09-17 (×5): qty 1

## 2012-09-17 NOTE — Progress Notes (Signed)
[id number] Earlene Plater RN, BSN, CCN: [phone] Case management. Chart reviewed for discharge planning and present needs./ following for poss. Home with hospice, limited code state, no crp but intubation will be decided by md. Discharge needs: none present at time of review. Next chart review due:  [id number]

## 2012-09-17 NOTE — Progress Notes (Signed)
Subjective: No CP of SOB.  Objective: Vital signs in last 24 hours: Temp:  [97.7 F (36.5 C)-99 F (37.2 C)] 98.3 F (36.8 C) (07/28 1200) Pulse Rate:  [67-123] 77 (07/28 1438) Resp:  [21-27] 27 (07/28 1438) BP: (112-171)/(51-83) 112/64 mmHg (07/28 1438) SpO2:  [90 %-99 %] 95 % (07/28 1438) FiO2 (%):  [50 %] 50 % (07/27 1840) Weight:  [145 lb 15.1 oz (66.2 kg)] 145 lb 15.1 oz (66.2 kg) (07/28 0500) Last BM Date: 09/17/12  Intake/Output from previous day: 07/27 0701 - 07/28 0700 In: 752.2 [I.V.:402.2; IV Piggyback:350] Out: 2231 [Urine:2231] Intake/Output this shift: Total I/O In: 456.7 [I.V.:100; IV Piggyback:356.7] Out: 900 [Urine:900]  Medications Current Facility-Administered Medications  Medication Dose Route Frequency Provider Last Rate Last Dose  . 0.9 %  sodium chloride infusion  250 mL Intravenous PRN Belkys A Regalado, MD   250 mL at 09/14/12 1612  . acetaminophen (TYLENOL) tablet 650 mg  650 mg Oral Q6H PRN Belkys A Regalado, MD   650 mg at 09/16/12 1350   Or  . acetaminophen (TYLENOL) suppository 650 mg  650 mg Rectal Q6H PRN Belkys A Regalado, MD   650 mg at 09/16/12 0034  . antiseptic oral rinse (BIOTENE) solution 15 mL  15 mL Mouth Rinse q12n4p Belkys A Regalado, MD   15 mL at 09/17/12 1600  . chlorhexidine (PERIDEX) 0.12 % solution 15 mL  15 mL Mouth Rinse BID Belkys A Regalado, MD   15 mL at 09/17/12 0909  . clopidogrel (PLAVIX) tablet 75 mg  75 mg Oral Q supper Belkys A Regalado, MD   75 mg at 09/15/12 1713  . dextrose 5 %-0.45 % sodium chloride infusion   Intravenous Continuous Kalman Shan, MD 10 mL/hr at 09/16/12 1134    . enoxaparin (LOVENOX) injection 30 mg  30 mg Subcutaneous Q24H Belkys A Regalado, MD   30 mg at 09/17/12 0910  . furosemide (LASIX) injection 20 mg  20 mg Intravenous Daily Kalman Shan, MD   20 mg at 09/17/12 1149  . levothyroxine (SYNTHROID, LEVOTHROID) tablet 88 mcg  88 mcg Oral QAC breakfast Belkys A Regalado, MD   88 mcg  at 09/17/12 0910  . metoprolol (LOPRESSOR) injection 2.5-5 mg  2.5-5 mg Intravenous Q3H PRN Simonne Martinet, NP   5 mg at 09/17/12 0958  . metoprolol (LOPRESSOR) tablet 50 mg  50 mg Oral BID Simonne Martinet, NP      . ondansetron [facility]) tablet 4 mg  4 mg Oral Q6H PRN Belkys A Regalado, MD       Or  . ondansetron (ZOFRAN) injection 4 mg  4 mg Intravenous Q6H PRN Belkys A Regalado, MD      . pantoprazole (PROTONIX) injection 40 mg  40 mg Intravenous Q24H Belkys A Regalado, MD   40 mg at 09/17/12 1533  . piperacillin-tazobactam (ZOSYN) IVPB 3.375 g  3.375 g Intravenous Q8H Belkys A Regalado, MD   3.375 g at 09/17/12 1533  . sodium chloride 0.9 % injection 3 mL  3 mL Intravenous Q12H Belkys A Regalado, MD   3 mL at 09/17/12 0911  . sodium chloride 0.9 % injection 3 mL  3 mL Intravenous PRN Belkys A Regalado, MD      . sodium phosphate 20 mmol in dextrose 5 % 250 mL infusion  20 mmol Intravenous Once Simonne Martinet, NP   20 mmol at 09/17/12 1149    PE: General appearance: alert, cooperative and no distress  Lungs: clear to auscultation bilaterally Heart: irregularly irregular rhythm Extremities: no LEE Pulses: 2+ and symmetric Neurologic: Grossly normal  Lab Results:   Recent Labs  09/15/12 0148 09/16/12 0343 09/17/12 0330  WBC 20.2* 13.4* 14.4*  HGB 13.9 14.1 15.1*  HCT 40.3 41.5 44.4  PLT 194 176 164   BMET  Recent Labs  09/15/12 0148 09/16/12 0343 09/17/12 0330  NA 134* 136 138  K 3.8 3.3* 3.0*  CL 99 101 97  CO2 25 26 27   GLUCOSE 137* 131* 105*  BUN 22 26* 26*  CREATININE 1.19* 1.32* 1.17*  CALCIUM 8.7 8.7 9.3    Assessment/Plan  Active Problems:   Sepsis   PNA (pneumonia)   Systolic CHF   Troponin level elevated   Severe sepsis(995.92)   Lactic acid acidosis   Abdominal pain   Acute respiratory failure with hypoxia   Delirium   Acute renal failure  Plan:  Looks good.  BP stable.  K+ continues to be low.  So far she has had of PO K today.  Will  recheck Troponin and see where she is at and make sure she is improving.  Last was 4.09.  Normal wall motion of echo, EF 65-70%. On plavix, lopressor and lasix.  Continue current therapy for CAD.     LOS: 3 days    Tracy Hicks 09/17/2012 5:30 PM

## 2012-09-17 NOTE — Progress Notes (Signed)
PULMONARY  / CRITICAL CARE MEDICINE  Name: Tracy Hicks MRN: [id number] DOB: 27-Nov-1919    ADMISSION DATE:  09/14/2012 CONSULTATION DATE:  7/25  REFERRING MD :  Hartley Barefoot PRIMARY SERVICE: TR[facility]  CHIEF COMPLAINT:  Concern for Sepsis    BRIEF:   79 y/o F with PMH of HTN, Dementia, HLD, CAD s/p NSTEMI with EF 45 % in 2011 (medically managed by Dr. Elsworth Soho) who is followed by Dr. Oneta Rack as PCP.  She was seen 7/19 for frequency, urgency and foul smelling urine and was treated with bactrim.  Unfortunately, began having nausea / vomiting on 7/23 and was changed to Cipro on 7/24.  She continued to have nausea / vomiting after antibiotic ingestion.  Seen at PCP office x2 on 7/24 and gave urine sample.  7/24 PM patient stayed awake most of evening, Son Leonette Most - primary caregiver) noted she also had decreased appetite and weakness.    At baseline, she walks with a walker and gets out 3x per day for a drive in the car with her son (he drives).  She uses depends for urinary incontinence.  He assists her with ADLS and food prep.    In ER, work up noted clean UA (on abx for ~1 wk pta), mild acute kidney injury, hyperglycemia, mild troponin & BNP leak, and leukocytosis with left shift.  Patient had low grade temp of 100.8, lactic acid of 6.8 and normotensive BP.  She had an episode of n/v in ER while flat for rectal suppository insertion with concern for potential aspiration.    LINES / TUBES: PIV 7/25>>> Foley 7/25>>>  CULTURES: UA 7/25>>>0-2 wbc, neg nitrite, neg leuks. NO GROWTH BCX2 7/25: >>>  ANTIBIOTICS: Cipro / Bactrim >>>as outpatient x5 days pta ........................................................................Marland Kitchen Vanco 7/25>>>7/25 Zosyn 7/25>>    SIGNIFICANT EVENTS / STUDIES:  7/25 - ADMIT She has leukocytosis, leftward shift and lactic acidosis all c/w severe sepsis. The source of sepsis is unclear. Her UA is unimpressive and recent suspected UTI is likely fully treated by  abx prescribed PTA. Her chief symptoms are all abdominal but CT notably unremarkable. It is possible that the abx caused severe N/V and the leukocytosis is simply stress demargination. Alternatively she could have mesenteric ischemia (for which a CT is not sensitive) +/- bacterial translocation. I don't see much evidence on CXR for PNA. With this in mind, I think zosyn is probably the best abx choice for now.   09/15/12: NSTEMI per troponin. Cards restarted plavix and advised med mgmt.  Plan tostart BB and nitrates later. CXR with RLL infitrates c/w aspiraton.  NO CPR but undecided on intubation    SUBJECTIVE/OVERNIGHT/INTERVAL HX WOB improved. O2 requirements improved. Feels better. Now in AF w/ RVR  Staff comment: awake, ate breakfast. Impropved resp status  VITAL SIGNS: Temp:  [97.7 F (36.5 C)-99 F (37.2 C)] 99 F (37.2 C) (07/28 0800) Pulse Rate:  [61-102] 81 (07/28 0549) Resp:  [19-28] 27 (07/28 0549) BP: (136-188)/(39-83) 150/53 mmHg (07/28 0549) SpO2:  [90 %-100 %] 91 % (07/28 0549) FiO2 (%):  [40 %-100 %] 50 % (07/27 1840) Weight:  [66.2 kg (145 lb 15.1 oz)] 66.2 kg (145 lb 15.1 oz) (07/28 0500) 2.5 liters via n/c HEMODYNAMICS:   VENTILATOR SETTINGS: Vent Mode:  [-]  FiO2 (%):  [40 %-100 %] 50 % INTAKE / OUTPUT: Intake/Output     07/27 0701 - 07/28 0700 07/28 0701 - 07/29 0700   I.V. (mL/kg) 392.2 (5.9)    IV Piggyback 350  Total Intake(mL/kg) 742.2 (11.2)    Urine (mL/kg/hr) 2231 (1.4)    Total Output 2231     Net -1488.8          Stool Occurrence 1 x      PHYSICAL EXAMINATION: General:  Frail elderly female in NAD Neuro: awake, confused but at BL mental status HEENT:  Mm pink/dry, no JVD Cardiovascular: AF on tele. HR rapid irreg irreg  Lungs:  resp's even/non-labored, lungs bilaterally diminished Abdomen:  Round/soft, bsx4 hypo, non-tender Musculoskeletal:  No acute deformities Skin:  Warm/dry  LABS: PULMONARY  Recent Labs Lab 09/14/12 1422  09/15/12 1012 09/16/12 0402  PHART 7.491* 7.506* 7.455*  PCO2ART 27.2* 28.3* 33.1*  PO2ART 105.0* 51.4* 77.5*  HCO3 20.6 22.2 22.8  TCO2 17.6 19.2 19.9  O2SAT 98.0 88.8 95.6   CBC  Recent Labs Lab 09/15/12 0148 09/16/12 0343 09/17/12 0330  HGB 13.9 14.1 15.1*  HCT 40.3 41.5 44.4  WBC 20.2* 13.4* 14.4*  PLT 194 176 164    COAGULATION No results found for this basename: INR,  in the last 168 hours  CARDIAC    Recent Labs Lab 09/14/12 1204 09/14/12 1346 09/14/12 1929 09/15/12 0148  TROPONINI 0.44* 0.45* 1.87* 4.09*    Recent Labs Lab 09/14/12 1204 09/16/12 0343  PROBNP 3827.0* 8809.0*     CHEMISTRY  Recent Labs Lab 09/14/12 1204 09/15/12 0148 09/16/12 0343 09/17/12 0330  NA 136 134* 136 138  K 3.6 3.8 3.3* 3.0*  CL 96 99 101 97  CO2 21 25 26 27   GLUCOSE 244* 137* 131* 105*  BUN 20 22 26* 26*  CREATININE 1.21* 1.19* 1.32* 1.17*  CALCIUM 9.8 8.7 8.7 9.3  MG  --   --  2.0 1.9  PHOS  --   --  2.7 2.0*  Estimated Creatinine Clearance: 29.2 ml/min (by C-G formula based on Cr of 1.17).   LIVER  Recent Labs Lab 09/14/12 1204 09/15/12 0148  AST 23 48*  ALT 16 27  ALKPHOS 82 66  BILITOT 0.5 0.4  PROT 6.9 5.7*  ALBUMIN 3.8 2.9*     INFECTIOUS  Recent Labs Lab 09/14/12 1346 09/14/12 1932 09/15/12 0148 09/16/12 0343 09/17/12 0330  LATICACIDVEN 6.8* 2.8*  --  1.4  --   PROCALCITON  --   --  0.30 0.51 0.36   ENDOCRINE CBG (last 3)  No results found for this basename: GLUCAP,  in the last 72 hours   IMAGING x48h  Dg Chest Port 1 View  09/17/2012   *RADIOLOGY REPORT*  Clinical Data: Evaluate for infiltrates.  PORTABLE CHEST - 1 VIEW  Comparison: Chest x-ray from 2 days prior.  Findings: Borderline cardiomegaly, unchanged from prior.  Extensive mitral annular calcification.  Aortic arch atherosclerosis.  Interstitial coarsening is decreased, as has a right lower lung indistinct opacity.  Cephalized pulmonary blood flow persists.  No  definite effusion.  No pneumothorax.  Hyperinflated lungs.  No acute osseous findings.  IMPRESSION:  1.  Improved pulmonary edema. 2.  Patchy lung opacities may represent persisting edema or infection. 3.  Probable COPD.   Original Report Authenticated By: Tiburcio Pea  aeration a little better when c/w 7/27    ASSESSMENT / PLAN:  PULMONARY A:  Acute respiratory failure in setting of aspiration PNA Aeration improved on 7/28 P - cont to wean FIO2 as able -oxygen to support sats >92% -pulmonary hygiene-->mobilize -aspiration precautions -repeat cxr in am -PRN BD's   CARDIOVASCULAR A:  CAD NSTEMI (7/26) Appears to  be in ? Acute on chronic systolic CHF (7/27) New onset AF (7/28) P:  -per [facility] -increase BB -cont plavix -if AF continues we will need to consider appropriateness of anticoagulation  - continue diuresis; helping hypoxemia and renal function (staff MD comment)  RENAL A:   Hypokalemia 09/15/12: ATN - improving. Likely due to vomit and volume loss 09/16/12: getting worse. BNP high  P:   -replace K -recheck lytes in am -hold further diuretics for now  GASTROINTESTINAL A:   Nausea / Vomiting - likely in setting of UTI.  CT ABD negative but lactic acidosis and high white count is concerning for potential abd ischemia on differential but would not be surgical candidate.  09/15/12: Possible DDx is drug related vomiting and volume loss resulting in lactic acidosis  09/16/12: D2 diet started by [facility]  P:   -d2 diet based on RN sueprvision   HEMATOLOGIC A:   Leukocytosis  P:  -see ID  INFECTIOUS A:   Recent UTI  Nausea / Vomiting, abdominal pain - concern for abd sepsis Leukocytosis, left shift Aspiration  P:   -Micro and abx as above  ENDOCRINE A:   Hyperglycemia - no hx of DM, stress response   P:   -monitor, if persistently elevated on BMP, consider SSI  NEUROLOGIC A:   Dementia   09/16/12:  Calm but confused and sleepy most of the time.  Needing frequent calming down by son. Improved P:   -supportive care  GLOBAL 09/15/12:  Son (who is 1 of 3 siblings) is at bedside and been caregiver for many years. All siblings are dpoa. He clearly said do not do cpr and do not make her suffer but wants her to be able to go home. He is equivocal of vent support but does not understand what it means other than "life support". HE declared no cpr and as I walked out of room he turned around and said, " I cannot play god, It is my mom and I need to talk to my siblings".  D/w [facility]" Dr Sunnie Nielsen will do goals of care   09/16/12: Long 45 min family meet with daughter Georgeann Oppenheim and son Billey Gosling (who is the bedside caregiver for many years). Want full medical care but NO CPR. Patient has been recently saying that she wants to go to heaven asap to be with her parents. Son Billey Gosling feels intubation will interfere with this process and will hurt her physically. HE is ok with bipap and prefers mom to die at home with home hospice. He does not want intubation but does not want to say that for fear of playing god. One other son who did not show up apparenty is for short term intubation. Daugther  daughter Georgeann Oppenheim "want to do everything possible short of being ridiculous". Family struggling with this decision. Definitely if at all only short term ventilation but if MD says time to palliate they are fine with it. Therefore,  Intubation left to discretion of MD based on clinical situation at that point.  Therefore, NO CPR. NO DEFIB. YES for BIPAP. YES for pressors. YEs for Meds. Goal is functional recovery. Intubation to be decided by MD/RN depending on status. Definitely NO SNF, NO LTAC, NO TRACH  7/28: improving pulmonary wise but now in AF w/ RVR. Will escalate rate control. Change to SDU status.      PETE BABCOCK NP  09/17/2012 9:19 AM   STAFF NOTE: I, Dr Lavinia Sharps have personally reviewed patient's available data, including  medical history, events of note,  physical examination and test results as part of my evaluation. I have discussed with resident/NP and other care providers such as pharmacist, RN and RRT.  In addition,  I personally evaluated patient and elicited key findings of improving mental status and resp failure and renal failure. Huge improvement today following lasix yesterday. However, has A Fib RVR which we will rate cotnrol. Son updated by myself.  Rest per NP/medical resident whose note is outlined above and that I agree with   Dr. Kalman Shan, M.D., [facility].C.P Pulmonary and Critical Care Medicine Staff Physician [facility] [place] [facility] Pager: [phone], If no answer or between  15:00h - 7:00h: call [phone]  09/17/2012 10:16 AM

## 2012-09-17 NOTE — Progress Notes (Signed)
Chaplain met with pt and sons at bedside in response to spiritual care consult request.   Introduced spiritual care as resource and worked to build therapeutic rapport with pt's sons Billey Gosling, Brett Canales).  Provided support around admission, progression of illness.  Charlie described relationship with pt and care he provides.  Spoke with chaplain about difficulty in having end of life conversations.  Now hopeful in pt's progress and looking forward to possible discharge.    Will follow up for continued assessment and support.   Please page as needs arise.   Belva Crome MDiv

## 2012-09-18 ENCOUNTER — Inpatient Hospital Stay (HOSPITAL_COMMUNITY): Payer: Medicare Other

## 2012-09-18 LAB — MAGNESIUM: Magnesium: 1.9 mg/dL (ref 1.5–2.5)

## 2012-09-18 LAB — COMPREHENSIVE METABOLIC PANEL
ALT: 60 U/L — ABNORMAL HIGH (ref 0–35)
Alkaline Phosphatase: 120 U/L — ABNORMAL HIGH (ref 39–117)
BUN: 30 mg/dL — ABNORMAL HIGH (ref 6–23)
CO2: 29 mEq/L (ref 19–32)
Chloride: 102 mEq/L (ref 96–112)
GFR calc Af Amer: 43 mL/min — ABNORMAL LOW (ref >90)
Glucose, Bld: 126 mg/dL — ABNORMAL HIGH (ref 70–99)
Potassium: 3.3 mEq/L — ABNORMAL LOW (ref 3.5–5.1)
Sodium: 142 mEq/L (ref 135–145)
Total Bilirubin: 1 mg/dL (ref 0.3–1.2)
Total Protein: 6.5 g/dL (ref 6.0–8.3)

## 2012-09-18 LAB — CBC
HCT: 43.9 % (ref 36.0–46.0)
Hemoglobin: 15.2 g/dL — ABNORMAL HIGH (ref 12.0–15.0)
RBC: 5.07 MIL/uL (ref 3.87–5.11)
WBC: 13.1 10*3/uL — ABNORMAL HIGH (ref 4.0–10.5)

## 2012-09-18 LAB — PHOSPHORUS: Phosphorus: 2.5 mg/dL (ref 2.3–4.6)

## 2012-09-18 LAB — PRO B NATRIURETIC PEPTIDE: Pro B Natriuretic peptide (BNP): 4375 pg/mL — ABNORMAL HIGH (ref 0–450)

## 2012-09-18 MED ORDER — ISOSORBIDE MONONITRATE ER 30 MG PO TB24
30.0000 mg | ORAL_TABLET | Freq: Every day | ORAL | Status: DC
  Administered 2012-09-18 – 2012-09-19 (×2): 30 mg via ORAL
  Filled 2012-09-18 (×3): qty 1

## 2012-09-18 MED ORDER — LEVOFLOXACIN 500 MG PO TABS
500.0000 mg | ORAL_TABLET | Freq: Every day | ORAL | Status: DC
  Administered 2012-09-18 – 2012-09-19 (×2): 500 mg via ORAL
  Filled 2012-09-18 (×3): qty 1

## 2012-09-18 MED ORDER — PANTOPRAZOLE SODIUM 40 MG PO TBEC
40.0000 mg | DELAYED_RELEASE_TABLET | Freq: Every day | ORAL | Status: DC
  Administered 2012-09-18 – 2012-09-22 (×5): 40 mg via ORAL
  Filled 2012-09-18 (×6): qty 1

## 2012-09-18 MED ORDER — DONEPEZIL HCL 10 MG PO TABS
10.0000 mg | ORAL_TABLET | Freq: Every day | ORAL | Status: DC
  Administered 2012-09-18 – 2012-09-21 (×4): 10 mg via ORAL
  Filled 2012-09-18 (×6): qty 1

## 2012-09-18 NOTE — Evaluation (Signed)
Physical Therapy Evaluation Patient Details Name: Tracy Hicks MRN: [id number] DOB: October 03, 1919 Today's Date: 09/18/2012 Time: 6962-9528 PT Time Calculation (min): 27 min  PT Assessment / Plan / Recommendation History of Present Illness  Tracy Hicks is a 79 y.o. female with PMH significant for dementia, CAD, history of NSTEMI 2011 treated with medical treatment, HTN who was brought to the ED by son due to generalized weakness, decrease appetite, vomiting. Patientvomit the night prior to admission multiple times, fluid content, no blood on it. Patient has being receiving treatment for UTI with ciprofloxacin for last week. Patient usually cough constantly at home for last 7 years. 1 week prior to admission , son notice strong urine odor, and increase urination. Patient was started on ciprofloxacin by PCP for presume UTI. Patient was seen by PCP day prior to admission because of no improvement and patient was started on bactrim.. Increased trop, NSTEMI  Clinical Impression  Pt requires extensive assistance for standing, not able to ambulate. Pt's son/caregiver provided info. Pt was ambulatory PTA. Pt will benefit from PT while in acute care. Per son, plans to return home. Pt will benefit from PT while in acute care.    PT Assessment  Patient needs continued PT services    Follow Up Recommendations  Home health PT    Does the patient have the potential to tolerate intense rehabilitation      Barriers to Discharge        Equipment Recommendations  None recommended by PT    Recommendations for Other Services     Frequency Min 3X/week    Precautions / Restrictions Precautions Precautions: Fall Precaution Comments: incontinence Restrictions Weight Bearing Restrictions: No   Pertinent Vitals/Pain Pt cries out when being moved.      Mobility  Bed Mobility Bed Mobility: Sit to Supine Sit to Supine: 1: +2 Total assist Sit to Supine: Patient Percentage: 0% Details for Bed Mobility  Assistance: assisted o get legs onto bed and to lie down. Transfers Transfers: Sit to Stand;Stand to Sit;Stand Pivot Transfers Sit to Stand: 1: +2 Total assist Sit to Stand: Patient Percentage: 40% Stand to Sit: 1: +2 Total assist Stand to Sit: Patient Percentage: 40% Stand Pivot Transfers: 1: +2 Total assist Stand Pivot Transfers: Patient Percentage: 40% Details for Transfer Assistance: Hand over hand to place hands on RW. Pt does not stand erect, flexed posture. pt did take small steps to get to bed with RW, Required extensive assist to keep trunk upright when standing. Ambulation/Gait Ambulation/Gait Assistance: Not tested (comment)    Exercises     PT Diagnosis: Difficulty walking;Generalized weakness  PT Problem List: Decreased strength;Decreased activity tolerance;Decreased balance;Decreased mobility;Decreased cognition;Decreased knowledge of use of DME;Decreased safety awareness;Decreased knowledge of precautions PT Treatment Interventions: DME instruction;Gait training;Functional mobility training;Therapeutic activities;Therapeutic exercise;Patient/family education     PT Goals(Current goals can be found in the care plan section) Acute Rehab PT Goals Patient Stated Goal: per son, to return home, for pt to walk. PT Goal Formulation: With family Time For Goal Achievement: 10/02/12 Potential to Achieve Goals: Fair  Visit Information  Last PT Received On: 09/18/12 Assistance Needed: +2 History of Present Illness: Tracy Hicks is a 79 y.o. female with PMH significant for dementia, CAD, history of NSTEMI 2011 treated with medical treatment, HTN who was brought to the ED by son due to generalized weakness, decrease appetite, vomiting. Patientvomit the night prior to admission multiple times, fluid content, no blood on it. Patient has being receiving treatment for UTI with ciprofloxacin  for last week. Patient usually cough constantly at home for last 7 years. 1 week prior to admission  , son notice strong urine odor, and increase urination. Patient was started on ciprofloxacin by PCP for presume UTI. Patient was seen by PCP day prior to admission because of no improvement and patient was started on bactrim.. Increased trop, NSTEMI       Prior Functioning  Home Living Family/patient expects to be discharged to:: Private residence Living Arrangements: Children Available Help at Discharge: Family Type of Home: House Home Access: Stairs to enter Secretary/administrator of Steps: 3 Entrance Stairs-Rails: None (has wall and trashcan to hold onto, son holds a hand.) Home Layout: One level Home Equipment: Walker - 4 wheels;Bedside commode;Transport chair Additional Comments: per son, pt goes outside several times a day with assistance Prior Function Level of Independence: Needs assistance Gait / Transfers Assistance Needed: son states that pt is able to get up and walk in house with RW unassisted. Communication / Swallowing Assistance Needed: impaired due to dementia    Cognition  Cognition Arousal/Alertness: Awake/alert Behavior During Therapy: Anxious Overall Cognitive Status: History of cognitive impairments - at baseline    Extremity/Trunk Assessment Lower Extremity Assessment Lower Extremity Assessment: Generalized weakness Cervical / Trunk Assessment Cervical / Trunk Assessment: Kyphotic   Balance Balance Balance Assessed: Yes Static Sitting Balance Static Sitting - Balance Support: No upper extremity supported Static Sitting - Level of Assistance: 3: Mod assist Static Sitting - Comment/# of Minutes: pt required assistance to lean forward in chair and support sitting at edge of bed. tends to lean posteriorly.  End of Session PT - End of Session Activity Tolerance: Patient limited by fatigue Patient left: in bed;with call bell/phone within reach;with family/visitor present Nurse Communication: Mobility status  GP     Rada Hay 09/18/2012, 11:32  AM  Blanchard Kelch PT [phone]

## 2012-09-18 NOTE — Progress Notes (Signed)
PULMONARY  / CRITICAL CARE MEDICINE  Name: Tracy Hicks MRN: [id number] DOB: 10/25/1919    ADMISSION DATE:  09/14/2012 CONSULTATION DATE:  7/25  REFERRING MD :  Hartley Barefoot PRIMARY SERVICE: TRH  CHIEF COMPLAINT:  Concern for Sepsis   BRIEF:   79 y/o F with PMH of HTN, Dementia, HLD, CAD s/p NSTEMI with EF 45 % in 2011 (medically managed by Dr. Elsworth Soho) who is followed by Dr. Oneta Rack as PCP.  She was seen 7/19 for frequency, urgency and foul smelling urine and was treated with bactrim.  Unfortunately, began having nausea / vomiting on 7/23 and was changed to Cipro on 7/24.  She continued to have nausea / vomiting after antibiotic ingestion.  Seen at PCP office x2 on 7/24 and gave urine sample.  7/24 PM patient stayed awake most of evening, Son Leonette Most - primary caregiver) noted she also had decreased appetite and weakness.    At baseline, she walks with a walker and gets out 3x per day for a drive in the car with her son (he drives).  She uses depends for urinary incontinence.  He assists her with ADLS and food prep.    In ER, work up noted clean UA (on abx for ~1 wk pta), mild acute kidney injury, hyperglycemia, mild troponin & BNP leak, and leukocytosis with left shift.  Patient had low grade temp of 100.8, lactic acid of 6.8 and normotensive BP.  She had an episode of n/v in ER while flat for rectal suppository insertion with concern for potential aspiration.    LINES / TUBES: PIV 7/25>>> Foley 7/25>>>7/29  CULTURES: UA 7/25>>>0-2 wbc, neg nitrite, neg leuks. NO GROWTH BCX2 7/25: >>>  ANTIBIOTICS: Cipro / Bactrim >>>as outpatient x5 days pta ........................................................................Marland Kitchen Vanco 7/25>>>7/25 Zosyn 7/25>> 7/29 levaquin 7/29>>>   SIGNIFICANT EVENTS / STUDIES:  7/25 - ADMIT She has leukocytosis, leftward shift and lactic acidosis all c/w severe sepsis. The source of sepsis is unclear. Her UA is unimpressive and recent suspected UTI is  likely fully treated by abx prescribed PTA. Her chief symptoms are all abdominal but CT notably unremarkable. It is possible that the abx caused severe N/V and the leukocytosis is simply stress demargination. Alternatively she could have mesenteric ischemia (for which a CT is not sensitive) +/- bacterial translocation. I don't see much evidence on CXR for PNA. With this in mind, I think zosyn is probably the best abx choice for now.   09/15/12: NSTEMI per troponin. Cards restarted plavix and advised med mgmt.  Plan tostart BB and nitrates later. CXR with RLL infitrates c/w aspiraton.  NO CPR but undecided on intubation    SUBJECTIVE/OVERNIGHT/INTERVAL HX Looks much more comfortable   VITAL SIGNS: Temp:  [97.7 F (36.5 C)-99.4 F (37.4 C)] 97.9 F (36.6 C) (07/29 0405) Pulse Rate:  [66-83] 66 (07/29 0820) Resp:  [24-27] 25 (07/29 0820) BP: (112-163)/(43-66) 126/49 mmHg (07/29 0820) SpO2:  [92 %-99 %] 97 % (07/29 0820) Weight:  [64 kg (141 lb 1.5 oz)] 64 kg (141 lb 1.5 oz) (07/29 0500) Room air  HEMODYNAMICS:   VENTILATOR SETTINGS:   INTAKE / OUTPUT: Intake/Output     07/28 0701 - 07/29 0700 07/29 0701 - 07/30 0700   I.V. (mL/kg) 240 (3.8) 20 (0.3)   IV Piggyback 406.7 50   Total Intake(mL/kg) 646.7 (10.1) 70 (1.1)   Urine (mL/kg/hr) 1565 (1)    Total Output 1565     Net -918.3 +70        Stool  Occurrence 1 x      PHYSICAL EXAMINATION: General:  Frail elderly female in NAD Neuro: awake, confused but at BL mental status HEENT:  Mm pink/dry, no JVD Cardiovascular:rrr Lungs:  resp's even/non-labored, lungs bilaterally diminished Abdomen:  Round/soft, bsx4 hypo, non-tender Musculoskeletal:  No acute deformities Skin:  Warm/dry  LABS: PULMONARY  Recent Labs Lab 09/14/12 1422 09/15/12 1012 09/16/12 0402  PHART 7.491* 7.506* 7.455*  PCO2ART 27.2* 28.3* 33.1*  PO2ART 105.0* 51.4* 77.5*  HCO3 20.6 22.2 22.8  TCO2 17.6 19.2 19.9  O2SAT 98.0 88.8 95.6   CBC  Recent  Labs Lab 09/16/12 0343 09/17/12 0330 09/18/12 0343  HGB 14.1 15.1* 15.2*  HCT 41.5 44.4 43.9  WBC 13.4* 14.4* 13.1*  PLT 176 164 182    COAGULATION No results found for this basename: INR,  in the last 168 hours  CARDIAC    Recent Labs Lab 09/14/12 1204 09/14/12 1346 09/14/12 1929 09/15/12 0148 09/18/12 0343  TROPONINI 0.44* 0.45* 1.87* 4.09* 0.98*    Recent Labs Lab 09/14/12 1204 09/16/12 0343 09/18/12 0343  PROBNP 3827.0* 8809.0* 4375.0*     CHEMISTRY  Recent Labs Lab 09/14/12 1204 09/15/12 0148 09/16/12 0343 09/17/12 0330 09/18/12 0343  NA 136 134* 136 138 142  K 3.6 3.8 3.3* 3.0* 3.3*  CL 96 99 101 97 102  CO2 21 25 26 27 29   GLUCOSE 244* 137* 131* 105* 126*  BUN 20 22 26* 26* 30*  CREATININE 1.21* 1.19* 1.32* 1.17* 1.24*  CALCIUM 9.8 8.7 8.7 9.3 9.5  MG  --   --  2.0 1.9 1.9  PHOS  --   --  2.7 2.0* 2.5  Estimated Creatinine Clearance: 27.1 ml/min (by C-G formula based on Cr of 1.24).   LIVER  Recent Labs Lab 09/14/12 1204 09/15/12 0148 09/18/12 0343  AST 23 48* 48*  ALT 16 27 60*  ALKPHOS 82 66 120*  BILITOT 0.5 0.4 1.0  PROT 6.9 5.7* 6.5  ALBUMIN 3.8 2.9* 2.9*     INFECTIOUS  Recent Labs Lab 09/14/12 1346 09/14/12 1932 09/15/12 0148 09/16/12 0343 09/17/12 0330  LATICACIDVEN 6.8* 2.8*  --  1.4  --   PROCALCITON  --   --  0.30 0.51 0.36   ENDOCRINE CBG (last 3)  No results found for this basename: GLUCAP,  in the last 72 hours   IMAGING x48h  Dg Chest Port 1 View  09/18/2012   *RADIOLOGY REPORT*  Clinical Data: Follow up airspace disease.  PORTABLE CHEST - 1 VIEW  Comparison: 09/17/2012.  Findings: There is little change in the chest radiograph. Cardiopericardial silhouette appears unchanged with dense mitral annular calcification.  Diffuse bilateral interstitial and airspace opacity is present, likely representing pulmonary edema. Superimposed infection cannot be excluded. Monitoring leads are projected over the  chest.  Aortic arch atherosclerosis.  IMPRESSION: No interval change.   Original Report Authenticated By: Andreas Newport, M.D.   Dg Chest Port 1 View  09/17/2012   *RADIOLOGY REPORT*  Clinical Data: Evaluate for infiltrates.  PORTABLE CHEST - 1 VIEW  Comparison: Chest x-ray from 2 days prior.  Findings: Borderline cardiomegaly, unchanged from prior.  Extensive mitral annular calcification.  Aortic arch atherosclerosis.  Interstitial coarsening is decreased, as has a right lower lung indistinct opacity.  Cephalized pulmonary blood flow persists.  No definite effusion.  No pneumothorax.  Hyperinflated lungs.  No acute osseous findings.  IMPRESSION:  1.  Improved pulmonary edema. 2.  Patchy lung opacities may represent persisting edema or  infection. 3.  Probable COPD.   Original Report Authenticated By: Tiburcio Pea  aeration a little better when c/w 7/27    ASSESSMENT / PLAN:  PULMONARY A:  Acute respiratory failure in setting of aspiration PNA Aeration improved on 7/28 and 7/29 Now on room air.  P -oxygen to support sats >92% -pulmonary hygiene-->mobilize -aspiration precautions -PRN BD's  -see ID section  CARDIOVASCULAR A:  CAD NSTEMI (7/26) Appears to be in ? Acute on chronic systolic CHF (7/27) New onset AF (7/28)-->resolved  P:  -per [facility] -increase BB -cont plavix - continue diuresis; helping hypoxemia and renal function  RENAL A:   Hypokalemia 09/15/12: ATN - improving. Likely due to vomit and volume loss 09/16/12: getting worse. BNP high  P:   -replace K -recheck lytes in am   GASTROINTESTINAL A:   Nausea / Vomiting - likely in setting of UTI.  CT ABD negative but lactic acidosis and high white count is concerning for potential abd ischemia on differential but would not be surgical candidate. 09/15/12: Possible DDx is drug related vomiting and volume loss resulting in lactic acidosis 09/16/12: D2 diet started by [facility]  P:   -d2 diet based on RN sueprvision    HEMATOLOGIC A:   Leukocytosis  P:  -see ID  INFECTIOUS A:   Recent UTI  Nausea / Vomiting, abdominal pain - concern for abd sepsis Leukocytosis, left shift Aspiration  P:   -Micro and abx as above -change to PO abx   ENDOCRINE A:   Hyperglycemia - no hx of DM, stress response   P:   -monitor, if persistently elevated on BMP, consider SSI  NEUROLOGIC A:   Dementia  At baseline   P:   -supportive care    09/16/12: Long 45 min family meet with daughter Georgeann Oppenheim and son Billey Gosling (who is the bedside caregiver for many years). Want full medical care but NO CPR. Patient has been recently saying that she wants to go to heaven asap to be with her parents. Son Billey Gosling feels intubation will interfere with this process and will hurt her physically. HE is ok with bipap and prefers mom to die at home with home hospice. He does not want intubation but does not want to say that for fear of playing god. One other son who did not show up apparenty is for short term intubation. Daugther  daughter Georgeann Oppenheim "want to do everything possible short of being ridiculous". Family struggling with this decision. Definitely if at all only short term ventilation but if MD says time to palliate they are fine with it. Therefore,  Intubation left to discretion of MD based on clinical situation at that point.  Therefore, NO CPR. NO DEFIB. YES for BIPAP. YES for pressors. YEs for Meds. Goal is functional recovery. Intubation to be decided by MD/RN depending on status. Definitely NO SNF, NO LTAC, NO TRACH  7/28: improving pulmonary wise but now in AF w/ RVR. Will escalate rate control. Change to SDU status.  7/29: much better. Off oxygen will move to medical ward. Prob home in next 24-48hrs     PETE BABCOCK NP  09/18/2012 10:57 AM   STAFF NOTE: I, Dr Lavinia Sharps have personally reviewed patient's available data, including medical history, events of note, physical examination and test results as part of my  evaluation. I have discussed with resident/NP and other care providers such as pharmacist, RN and RRT.  In addition,  I personally evaluated patient and elicited key findings of acute  resp filure, ATN NSTEMI, aspiration pna, CHF all folloiwng bactrim. Improved. Move to tele floor under [facility]  Rest per NP/medical resident whose note is outlined above and that I agree with   .  Dr. Kalman Shan, M.D., [facility] Pulmonary and Critical Care Medicine Staff Physician [facility] Pager: [phone], If no answer or between  15:00h - 7:00h: call [phone]  09/18/2012 1:27 PM

## 2012-09-18 NOTE — Progress Notes (Signed)
No further cardiac treatment suggestions at this time. We would like to see her in follow-up in the office after discharge with an MLP or Dr. Royann Shivers.  Chrystie Nose, MD, [facility] Attending Cardiologist [facility]

## 2012-09-18 NOTE — Progress Notes (Signed)
Speech Language Pathology Dysphagia Treatment Patient Details Name: Tracy Hicks MRN: [id number] DOB: 08/22/1919 Today's Date: 09/18/2012 Time: 4098-1191 SLP Time Calculation (min): 16 min  Assessment / Plan / Recommendation Clinical Impression  Improvement in status since BSE. Pt currently on room air, alert, cooperative.  Pt currently receiving Dys 2 diet/thin liquids, appears to be tolerating this diet but has poor po intake.  Son reports pt tends to chew large pills.  Recommend providing pills in puree one at a time.  Son maintains pt has no swallowing issues, so SLP educated re: energy conservation as primary purpose of conservative diet.  Anticipate ease of advancement as strength and endurance improve.  No further ST intervention recommended at this time.  Please reconsult if needs arise.    Diet Recommendation  Continue with Current Diet: Dysphagia 2 (fine chop);Thin liquid    SLP Plan All goals met   Pertinent Vitals/Pain No pain reported   Swallowing Goals  SLP Swallowing Goals Patient will utilize recommended strategies during swallow to increase swallowing safety with: Moderate cueing Swallow Study Goal #2 - Progress: Met  General Temperature Spikes Noted: No Respiratory Status: Room air Behavior/Cognition: Alert;Cooperative;Pleasant mood Oral Cavity - Dentition: Dentures, top;Dentures, bottom Patient Positioning: Upright in bed  Oral Cavity - Oral Hygiene Does patient have any of the following "at risk" factors?: Saliva - thick, dry mouth Brush patient's teeth BID with toothbrush (using toothpaste with fluoride): Yes Patient is AT RISK - Oral Care Protocol followed (see row info): Yes   Dysphagia Treatment Treatment focused on: Skilled observation of diet tolerance;Patient/family/caregiver Dealer Educated: Son Treatment Methods/Modalities: Skilled observation Patient observed directly with PO's: Yes Type of PO's observed: Dysphagia 1 (puree);Thin  liquids Feeding: Needs assist Liquids provided via: Straw Type of cueing: Verbal Amount of cueing: Minimal   Celia B. Murvin [facility], CCC-SLP [phone]  Leigh Aurora 09/18/2012, 12:19 PM

## 2012-09-18 NOTE — Evaluation (Signed)
Occupational Therapy Evaluation Patient Details Name: Tracy Hicks MRN: [id number] DOB: 16-Nov-1919 Today's Date: 09/18/2012 Time: 1478-2956 OT Time Calculation (min): 25 min  OT Assessment / Plan / Recommendation History of present illness Tracy Hicks is a 79 y.o. female with PMH significant for dementia, CAD, history of NSTEMI 2011 treated with medical treatment, HTN who was brought to the ED by son due to generalized weakness, decrease appetite, vomiting. Patientvomit the night prior to admission multiple times Patient has being receiving treatment for UTI with ciprofloxacin for last week. 1 week prior to admission , son notice strong urine odor, and increase urination. Patient was started on ciprofloxacin by PCP for presume UTI. Patient was seen by PCP day prior to admission because of no improvement and patient was started on bactrim.. Increased trop, NSTEMI   Clinical Impression   This 79 year old female was admitted for generalized weakness, decreased appetite and vomiting.  She is able to get to toilet by herself 1/2 of the time.  Sons assists with all adls.  OT will follow her with toileting and standing balance goals so that he can assist her with adls safely.     OT Assessment  Patient needs continued OT Services    Follow Up Recommendations  Home health OT;No OT follow up;Supervision/Assistance - 24 hour (depending upon progress)    Barriers to Discharge      Equipment Recommendations   (to be further evaluated for 3:1 commode)    Recommendations for Other Services    Frequency  Min 2X/week    Precautions / Restrictions Precautions Precautions: Fall Precaution Comments: incontinence Restrictions Weight Bearing Restrictions: No   Pertinent Vitals/Pain VSS.  No c/o pain but pt did say "oh" when being cleaned up   ADL  Toilet Transfer: Simulated;+2 Total assistance Toilet Transfer: Patient Percentage: 40% Toilet Transfer Method: Stand pivot Toilet Transfer Equipment:   (chair to bed) Toileting - Clothing Manipulation and Hygiene: Performed;+2 Total assistance Toileting - Clothing Manipulation and Hygiene: Patient Percentage: 0% Where Assessed - Toileting Clothing Manipulation and Hygiene: Rolling right and/or left Equipment Used: Rolling walker ADL Comments: sons helps with all adls.  Pt was able to get to toilet by herself about 1/2 of the time.  (Raised toilet seat on toilet)    OT Diagnosis: Generalized weakness  OT Problem List: Decreased strength;Decreased activity tolerance;Impaired balance (sitting and/or standing);Decreased cognition OT Treatment Interventions: Self-care/ADL training;Patient/family education;Balance training;Cognitive remediation/compensation;DME and/or AE instruction   OT Goals(Current goals can be found in the care plan section) Acute Rehab OT Goals Patient Stated Goal: per son, to return home, for pt to walk. OT Goal Formulation: With family Time For Goal Achievement: 10/02/12 Potential to Achieve Goals: Good ADL Goals Pt Will Transfer to Toilet: with min assist;stand pivot transfer;bedside commode Additional ADL Goal #1: pt will maintain static standing for 2 minutes with min guard for adls.  Visit Information  Last OT Received On: 09/18/12 Assistance Needed: +2 PT/OT Co-Evaluation/Treatment: Yes        Prior Functioning     Home Living Family/patient expects to be discharged to:: Private residence Living Arrangements: Children Available Help at Discharge: Family Type of Home: House Home Access: Stairs to enter Secretary/administrator of Steps: 3 Entrance Stairs-Rails: None (has wall and trashcan to hold onto, son holds a hand.) Home Layout: One level Home Equipment: Walker - 4 wheels;Bedside commode;Transport chair Additional Comments: per son, pt goes outside several times a day with assistance Prior Function Level of Independence: Needs assistance Gait /  Transfers Assistance Needed: son states that pt is  able to get up and walk in house with RW unassisted. Communication / Swallowing Assistance Needed: impaired due to dementia Comments: pt gets herself to bathroom 1/2 time but needs help with hygiene.  Sons helps with all adls.   Communication Communication: No difficulties (little verbalization)         Vision/Perception     Cognition  Cognition Arousal/Alertness: Awake/alert Behavior During Therapy: Anxious Overall Cognitive Status: History of cognitive impairments - at baseline    Extremity/Trunk Assessment Upper Extremity Assessment Upper Extremity Assessment: Generalized weakness Lower Extremity Assessment Lower Extremity Assessment: Generalized weakness Cervical / Trunk Assessment Cervical / Trunk Assessment: Kyphotic     Mobility Bed Mobility Bed Mobility: Sit to Supine Rolling Right: 2: Max assist Rolling Left: 2: Max assist Sit to Supine: 1: +2 Total assist Sit to Supine: Patient Percentage: 0% Details for Bed Mobility Assistance: assisted o get legs onto bed and to lie down. Transfers Sit to Stand: 1: +2 Total assist Sit to Stand: Patient Percentage: 40% Stand to Sit: 1: +2 Total assist Stand to Sit: Patient Percentage: 40% Details for Transfer Assistance: Hand over hand to place hands on RW. Pt does not stand erect, flexed posture. pt did take small steps to get to bed with RW, Required extensive assist to keep trunk upright when standing.     Exercise     Balance Balance Balance Assessed: Yes Static Sitting Balance Static Sitting - Balance Support: No upper extremity supported Static Sitting - Level of Assistance: 3: Mod assist Static Sitting - Comment/# of Minutes: pt required assistance to lean forward in chair and support sitting at edge of bed. tends to lean posteriorly.   End of Session OT - End of Session Activity Tolerance: Patient limited by fatigue Patient left: in bed;with family/visitor present;with call bell/phone within reach  GO      Ashraf Mesta 09/18/2012, 12:20 PM [name], OTR/L [phone] 09/18/2012

## 2012-09-19 MED ORDER — ENSURE PUDDING PO PUDG
1.0000 | ORAL | Status: DC
  Administered 2012-09-19 – 2012-09-21 (×2): 1 via ORAL
  Filled 2012-09-19 (×4): qty 1

## 2012-09-19 MED ORDER — DILTIAZEM LOAD VIA INFUSION
10.0000 mg | Freq: Once | INTRAVENOUS | Status: AC
  Administered 2012-09-19: 10 mg via INTRAVENOUS
  Filled 2012-09-19: qty 10

## 2012-09-19 MED ORDER — DILTIAZEM LOAD VIA INFUSION: 10.0000 mg | Freq: Once | INTRAVENOUS | Status: DC

## 2012-09-19 MED ORDER — BOOST PLUS PO LIQD
237.0000 mL | ORAL | Status: DC
  Administered 2012-09-21 – 2012-09-22 (×2): 237 mL via ORAL
  Filled 2012-09-19 (×3): qty 237

## 2012-09-19 MED ORDER — METOPROLOL TARTRATE 1 MG/ML IV SOLN: 5.0000 mg | Freq: Once | INTRAVENOUS | Status: DC

## 2012-09-19 MED ORDER — SODIUM CHLORIDE 0.9 % IV SOLN: INTRAVENOUS | Status: DC

## 2012-09-19 MED ORDER — ADULT MULTIVITAMIN W/MINERALS CH
1.0000 | ORAL_TABLET | Freq: Every day | ORAL | Status: DC
  Administered 2012-09-19 – 2012-09-22 (×4): 1 via ORAL
  Filled 2012-09-19 (×4): qty 1

## 2012-09-19 MED ORDER — DILTIAZEM HCL 100 MG IV SOLR
5.0000 mg/h | INTRAVENOUS | Status: DC
  Administered 2012-09-19: 5 mg/h via INTRAVENOUS
  Administered 2012-09-19: 10 mg/h via INTRAVENOUS
  Filled 2012-09-19: qty 100

## 2012-09-19 MED ORDER — SODIUM CHLORIDE 0.9 % IV BOLUS (SEPSIS)
500.0000 mL | Freq: Once | INTRAVENOUS | Status: AC
  Administered 2012-09-19: 500 mL via INTRAVENOUS

## 2012-09-19 MED ORDER — FUROSEMIDE 20 MG PO TABS
20.0000 mg | ORAL_TABLET | Freq: Every day | ORAL | Status: DC
  Administered 2012-09-19 – 2012-09-22 (×4): 20 mg via ORAL
  Filled 2012-09-19 (×4): qty 1

## 2012-09-19 MED ORDER — METOPROLOL TARTRATE 1 MG/ML IV SOLN
INTRAVENOUS | Status: AC
  Filled 2012-09-19: qty 5

## 2012-09-19 MED ORDER — METOPROLOL TARTRATE 25 MG PO TABS
25.0000 mg | ORAL_TABLET | Freq: Two times a day (BID) | ORAL | Status: DC
  Administered 2012-09-19 – 2012-09-22 (×6): 25 mg via ORAL
  Filled 2012-09-19 (×7): qty 1

## 2012-09-19 MED ORDER — DILTIAZEM HCL 100 MG IV SOLR: 5.0000 mg/h | INTRAVENOUS | Status: DC

## 2012-09-19 MED ORDER — DILTIAZEM HCL 60 MG PO TABS
60.0000 mg | ORAL_TABLET | Freq: Three times a day (TID) | ORAL | Status: DC
  Administered 2012-09-19 – 2012-09-22 (×10): 60 mg via ORAL
  Filled 2012-09-19 (×12): qty 1

## 2012-09-19 MED ORDER — MORPHINE SULFATE 2 MG/ML IJ SOLN
1.0000 mg | Freq: Once | INTRAMUSCULAR | Status: AC
  Administered 2012-09-19: 1 mg via INTRAVENOUS
  Filled 2012-09-19: qty 1

## 2012-09-19 MED ORDER — ISOSORBIDE MONONITRATE ER 60 MG PO TB24
60.0000 mg | ORAL_TABLET | Freq: Every day | ORAL | Status: DC
  Administered 2012-09-20 – 2012-09-21 (×2): 60 mg via ORAL
  Filled 2012-09-19 (×3): qty 1

## 2012-09-19 MED ORDER — LEVOFLOXACIN 750 MG PO TABS
750.0000 mg | ORAL_TABLET | ORAL | Status: DC
  Administered 2012-09-21: 750 mg via ORAL
  Filled 2012-09-19: qty 1

## 2012-09-19 NOTE — Progress Notes (Signed)
PULMONARY  / CRITICAL CARE MEDICINE  Name: Tracy Hicks MRN: [id number] DOB: 09/16/1919    ADMISSION DATE:  09/14/2012 CONSULTATION DATE:  7/25  REFERRING MD :  Hartley Barefoot PRIMARY SERVICE: TRH  CHIEF COMPLAINT:  Concern for Sepsis   BRIEF:   79 y/o F with PMH of HTN, Dementia, HLD, CAD s/p NSTEMI with EF 45 % in 2011 (medically managed by Dr. Elsworth Soho) who is followed by Dr. Oneta Rack as PCP.  She was seen 7/19 for frequency, urgency and foul smelling urine and was treated with bactrim.  Unfortunately, began having nausea / vomiting on 7/23 and was changed to Cipro on 7/24.  She continued to have nausea / vomiting after antibiotic ingestion.  Seen at PCP office x2 on 7/24 and gave urine sample.  7/24 PM patient stayed awake most of evening, Son Leonette Most - primary caregiver) noted she also had decreased appetite and weakness.    At baseline, she walks with a walker and gets out 3x per day for a drive in the car with her son (he drives).  She uses depends for urinary incontinence.  He assists her with ADLS and food prep.    In ER, work up noted clean UA (on abx for ~1 wk pta), mild acute kidney injury, hyperglycemia, mild troponin & BNP leak, and leukocytosis with left shift.  Patient had low grade temp of 100.8, lactic acid of 6.8 and normotensive BP.  She had an episode of n/v in ER while flat for rectal suppository insertion with concern for potential aspiration.    LINES / TUBES: PIV 7/25>>> Foley 7/25>>>7/29  CULTURES: UA 7/25>>>0-2 wbc, neg nitrite, neg leuks. NO GROWTH BCX2 7/25: >>>  ANTIBIOTICS: Cipro / Bactrim >>>as outpatient x5 days pta ........................................................................Marland Kitchen Vanco 7/25>>>7/25 Zosyn 7/25>> 7/29 levaquin 7/29>>>   SIGNIFICANT EVENTS / STUDIES:  7/25 - ADMIT She has leukocytosis, leftward shift and lactic acidosis all c/w severe sepsis. The source of sepsis is unclear. Her UA is unimpressive and recent suspected UTI is  likely fully treated by abx prescribed PTA. Her chief symptoms are all abdominal but CT notably unremarkable. It is possible that the abx caused severe N/V and the leukocytosis is simply stress demargination. Alternatively she could have mesenteric ischemia (for which a CT is not sensitive) +/- bacterial translocation. I don't see much evidence on CXR for PNA. With this in mind, I think zosyn is probably the best abx choice for now.   09/15/12: NSTEMI per troponin. Cards restarted plavix and advised med mgmt.  Plan tostart BB and nitrates later. CXR with RLL infitrates c/w aspiraton.  NO CPR but undecided on intubation    SUBJECTIVE/OVERNIGHT/INTERVAL HX Looks much more comfortable   VITAL SIGNS: Temp:  [97.2 F (36.2 C)-98.8 F (37.1 C)] 97.2 F (36.2 C) (07/30 0400) Pulse Rate:  [74-125] 125 (07/30 0100) Resp:  [18-31] 21 (07/30 0900) BP: (70-165)/(40-99) 156/63 mmHg (07/30 0900) SpO2:  [87 %-97 %] 91 % (07/30 0900) FiO2 (%):  [50 %] 50 % (07/30 0800) Weight:  [63.3 kg (139 lb 8.8 oz)-64 kg (141 lb 1.5 oz)] 63.3 kg (139 lb 8.8 oz) (07/30 0200) Room air  HEMODYNAMICS:   VENTILATOR SETTINGS: Vent Mode:  [-]  FiO2 (%):  [50 %] 50 % INTAKE / OUTPUT: Intake/Output     07/29 0701 - 07/30 0700 07/30 0701 - 07/31 0700   P.O. 120    I.V. (mL/kg) 95 (1.5) 10 (0.2)   Other 650    IV Piggyback 50  Total Intake(mL/kg) 915 (14.5) 10 (0.2)   Urine (mL/kg/hr) 1475 (1)    Total Output 1475     Net -560 +10          PHYSICAL EXAMINATION: General:  Frail elderly female in NAD Neuro: awake, confused but at BL mental status HEENT:  Mm pink/dry, no JVD Cardiovascular:rrr Lungs:  resp's even/non-labored, lungs bilaterally diminished Abdomen:  Round/soft, bsx4 hypo, non-tender Musculoskeletal:  No acute deformities Skin:  Warm/dry  LABS: PULMONARY  Recent Labs Lab 09/14/12 1422 09/15/12 1012 09/16/12 0402  PHART 7.491* 7.506* 7.455*  PCO2ART 27.2* 28.3* 33.1*  PO2ART 105.0*  51.4* 77.5*  HCO3 20.6 22.2 22.8  TCO2 17.6 19.2 19.9  O2SAT 98.0 88.8 95.6   CBC  Recent Labs Lab 09/16/12 0343 09/17/12 0330 09/18/12 0343  HGB 14.1 15.1* 15.2*  HCT 41.5 44.4 43.9  WBC 13.4* 14.4* 13.1*  PLT 176 164 182    COAGULATION No results found for this basename: INR,  in the last 168 hours  CARDIAC    Recent Labs Lab 09/14/12 1204 09/14/12 1346 09/14/12 1929 09/15/12 0148 09/18/12 0343  TROPONINI 0.44* 0.45* 1.87* 4.09* 0.98*    Recent Labs Lab 09/14/12 1204 09/16/12 0343 09/18/12 0343  PROBNP 3827.0* 8809.0* 4375.0*     CHEMISTRY  Recent Labs Lab 09/14/12 1204 09/15/12 0148 09/16/12 0343 09/17/12 0330 09/18/12 0343  NA 136 134* 136 138 142  K 3.6 3.8 3.3* 3.0* 3.3*  CL 96 99 101 97 102  CO2 21 25 26 27 29   GLUCOSE 244* 137* 131* 105* 126*  BUN 20 22 26* 26* 30*  CREATININE 1.21* 1.19* 1.32* 1.17* 1.24*  CALCIUM 9.8 8.7 8.7 9.3 9.5  MG  --   --  2.0 1.9 1.9  PHOS  --   --  2.7 2.0* 2.5  Estimated Creatinine Clearance: 27 ml/min (by C-G formula based on Cr of 1.24).  Recent Labs Lab 09/14/12 1929 09/15/12 0148 09/18/12 0343  TROPONINI 1.87* 4.09* 0.98*    LIVER  Recent Labs Lab 09/14/12 1204 09/15/12 0148 09/18/12 0343  AST 23 48* 48*  ALT 16 27 60*  ALKPHOS 82 66 120*  BILITOT 0.5 0.4 1.0  PROT 6.9 5.7* 6.5  ALBUMIN 3.8 2.9* 2.9*     INFECTIOUS  Recent Labs Lab 09/14/12 1346 09/14/12 1932 09/15/12 0148 09/16/12 0343 09/17/12 0330  LATICACIDVEN 6.8* 2.8*  --  1.4  --   PROCALCITON  --   --  0.30 0.51 0.36   ENDOCRINE CBG (last 3)  No results found for this basename: GLUCAP,  in the last 72 hours   IMAGING x48h  Dg Chest Port 1 View  09/18/2012   *RADIOLOGY REPORT*  Clinical Data: Follow up airspace disease.  PORTABLE CHEST - 1 VIEW  Comparison: 09/17/2012.  Findings: There is little change in the chest radiograph. Cardiopericardial silhouette appears unchanged with dense mitral annular calcification.   Diffuse bilateral interstitial and airspace opacity is present, likely representing pulmonary edema. Superimposed infection cannot be excluded. Monitoring leads are projected over the chest.  Aortic arch atherosclerosis.  IMPRESSION: No interval change.   Original Report Authenticated By: Andreas Newport, M.D.  aeration a little better when c/w 7/27    ASSESSMENT / PLAN:  PULMONARY A:  Acute respiratory failure in setting of aspiration PNA Aeration improved on 7/28 and 7/29 Now on room air.  P -oxygen to support sats >92% -pulmonary hygiene-->mobilize -aspiration precautions -PRN BD's  -see ID section  CARDIOVASCULAR A:  CAD NSTEMI (  7/26) Appears to be in ? Acute on chronic systolic CHF (7/27) New onset AF (7/28), had recurrent episode of RVR on 7/29 landing her back in ICU  P:  -per [facility] -change lopressor to 25 bid, add ccb -change imdur to 60mg  daily (have discussed regimen w/ cards) -cont plavix - continue diuresis; helping hypoxemia and renal function, will back down to daily dosing  RENAL A:   Hypokalemia 09/15/12: ATN - improving. Likely due to vomit and volume loss 09/16/12: getting worse. BNP high  P:   -replace K -recheck lytes in am   GASTROINTESTINAL A:   Nausea / Vomiting - likely in setting of UTI.  CT ABD negative but lactic acidosis and high white count is concerning for potential abd ischemia on differential but would not be surgical candidate. 09/15/12: Possible DDx is drug related vomiting and volume loss resulting in lactic acidosis 09/16/12: D2 diet started by [facility]  P:   -d2 diet based on RN sueprvision   HEMATOLOGIC A:   Leukocytosis  P:  -see ID  INFECTIOUS A:   Recent UTI  Nausea / Vomiting, abdominal pain - concern for abd sepsis Leukocytosis, left shift Aspiration  P:   -Micro and abx as above -change to PO abx   ENDOCRINE A:   Hyperglycemia - no hx of DM, stress response   P:   -monitor, if persistently elevated on BMP,  consider SSI  NEUROLOGIC A:   Dementia  At baseline   P:   -supportive care   Therefore, NO CPR. NO DEFIB. YES for BIPAP. YES for pressors. YEs for Meds. Goal is functional recovery. Intubation to be decided by MD/RN depending on status. Definitely NO SNF, NO LTAC, NO TRACH    PETE BABCOCLK NP  09/19/2012 9:03 AM      STAFF NOTE: I, Dr Lavinia Sharps have personally reviewed patient's available data, including medical history, events of note, physical examination and test results as part of my evaluation. I have discussed with resident/NP and other care providers such as pharmacist, RN and RRT.  In addition,  I personally evaluated patient and elicited key findings of bounce back to ICU after floor RN apparently held lopressor night dose last night resulting in A FIB RVR. Son very upset about return to ICU and feel floor nursing workflow is inadwquate. HE declined to meet with patient rep and service excellence. WE discussed patient cardiac regiment with cardiollogy and have updated him their recommendations.  Rest per NP/medical resident whose note is outlined above and that I agree with   Dr. Kalman Shan, M.D., [facility].C.P Pulmonary and Critical Care Medicine Staff Physician [facility] [place] [facility] Pager: [phone], If no answer or between  15:00h - 7:00h: call [phone]  09/19/2012 11:04 PM

## 2012-09-19 NOTE — Progress Notes (Signed)
Pt c/o chest pain. V/S 114/84,20,95%, hr 135-170s, EKG done Atrial Fib with RVR. Paged elink MD awaiting orders, pt's son at the bedside is having chest pain and tightness. Will monitor.

## 2012-09-19 NOTE — Progress Notes (Signed)
OT Cancellation Note  Patient Details Name: Tracy Hicks MRN: [id number] DOB: 03-05-1919   Cancelled Treatment:    Reason Eval/Treat Not Completed: Medical issues which prohibited therapy CP, high HR, low BP.  Finas Delone 09/19/2012, 7:37 AM [name], OTR/L [phone] 09/19/2012

## 2012-09-19 NOTE — Progress Notes (Signed)
INITIAL NUTRITION ASSESSMENT  DOCUMENTATION CODES Per approved criteria  -Not Applicable   INTERVENTION: Provide Boost Plus once daily Provide Ensure Pudding once daily Provide Magic Cup with ice cream once daily Provide Multivitamin with minerals daily  NUTRITION DIAGNOSIS: Inadequate oral intake related to medical condition as evidenced by pt eating 0-10% of meals.   Goal: Pt to meet >/= 90% of their estimated nutrition needs   Monitor:  PO intake Weight Labs  Reason for Assessment: Low Braden  79 y.o. female  Admitting Dx: <principal problem not specified>  ASSESSMENT: 79 y/o F admitted with probable UTI. PCCM consulted for concern for sepsis.  Pt asleep at time of visit. Per RN pt has only had a few bites of applesauce with medications today and is drinking very little. Per son, pt was eating well PTA with 2-3 meals and snacks daily; she loves sweets. Son states that pt will gulp down Ensure but, it comes right back up. Pt has lost 8 lbs since admission.   Height: Ht Readings from Last 1 Encounters:  09/18/12 5\' 3"  (1.6 m)    Weight: Wt Readings from Last 1 Encounters:  09/19/12 139 lb 8.8 oz (63.3 kg)    Ideal Body Weight: 115 lbs  % Ideal Body Weight: 120%  Wt Readings from Last 10 Encounters:  09/19/12 139 lb 8.8 oz (63.3 kg)    Usual Body Weight: unknown  % Usual Body Weight: NA  BMI:  Body mass index is 24.73 kg/(m^2).  Estimated Nutritional Needs: Kcal: 1350-1580 Protein: 60-75 grams Fluid: 1.9 L  Skin: intact; non-pitting RLE and LLE edema  Diet Order: Dysphagia  EDUCATION NEEDS: -No education needs identified at this time   Intake/Output Summary (Last 24 hours) at 09/19/12 1507 Last data filed at 09/19/12 1400  Gross per 24 hour  Intake   1100 ml  Output   1450 ml  Net   -350 ml    Last BM: 7/28  Labs:   Recent Labs Lab 09/16/12 0343 09/17/12 0330 09/18/12 0343  NA 136 138 142  K 3.3* 3.0* 3.3*  CL 101 97 102  CO2  26 27 29   BUN 26* 26* 30*  CREATININE 1.32* 1.17* 1.24*  CALCIUM 8.7 9.3 9.5  MG 2.0 1.9 1.9  PHOS 2.7 2.0* 2.5  GLUCOSE 131* 105* 126*    CBG (last 3)  No results found for this basename: GLUCAP,  in the last 72 hours  Scheduled Meds: . antiseptic oral rinse  15 mL Mouth Rinse q12n4p  . clopidogrel  75 mg Oral Q supper  . diltiazem  60 mg Oral Q8H  . donepezil  10 mg Oral QHS  . enoxaparin (LOVENOX) injection  30 mg Subcutaneous Q24H  . furosemide  20 mg Oral Daily  . [START ON 09/20/2012] isosorbide mononitrate  60 mg Oral QHS  . [START ON 09/21/2012] levofloxacin  750 mg Oral Q48H  . levothyroxine  88 mcg Oral QAC breakfast  . metoprolol tartrate  25 mg Oral BID  . pantoprazole  40 mg Oral Q1200    Continuous Infusions: . sodium chloride 10 mL/hr (09/19/12 0915)    Past Medical History  Diagnosis Date  . Hypertension   . Dementia   . Hyperlipidemia   . Coronary artery disease   . STEMI (ST elevation myocardial infarction)   . Hyperlipidemia   . Stroke     History reviewed. No pertinent past surgical history.  Ian Malkin RD, LDN Inpatient Clinical Dietitian Pager: [phone] After  Hours Pager: [phone]

## 2012-09-19 NOTE — Progress Notes (Signed)
PT Cancellation Note  Patient Details Name: Tracy Hicks MRN: [id number] DOB: 1919/07/09   Cancelled Treatment:    Reason Eval/Treat Not Completed: Medical issues which prohibited therapy. Pt transferred to ICU with chest pain.   Rada Hay 09/19/2012, 7:37 AM Blanchard Kelch PT [phone]

## 2012-09-19 NOTE — Progress Notes (Signed)
eLink Physician-Brief Progress Note Patient Name: Tracy Hicks DOB: 01-24-1920 MRN: [id number]  Date of Service  09/19/2012   HPI/Events of Note  Call from nurse reporting that patient is c/o of chest pain - non radiating and has been found to be in AF/RVR with HR to the 170s.  Patient a limited code stating that she is to not receive antiarrhythmics.  Dicussed code status with son who was at bedside.  He is clear in his statement that the only thing the family wanted re code status was no shocking and no chest compressions.  They would like for the arrrhtymia to be treated.   eICU Interventions  Plan: Transfer to ICU for treatment of AF/RVR Adjust code status   Intervention Category Intermediate Interventions: Arrhythmia - evaluation and management  DETERDING,ELIZABETH 09/19/2012, 1:02 AM

## 2012-09-19 NOTE — Progress Notes (Signed)
Pt transferred to ICU for closer monitoring report given Son at bedside.

## 2012-09-20 LAB — COMPREHENSIVE METABOLIC PANEL
AST: 41 U/L — ABNORMAL HIGH (ref 0–37)
Albumin: 2.5 g/dL — ABNORMAL LOW (ref 3.5–5.2)
Calcium: 8.8 mg/dL (ref 8.4–10.5)
Creatinine, Ser: 1 mg/dL (ref 0.50–1.10)
GFR calc non Af Amer: 48 mL/min — ABNORMAL LOW (ref >90)

## 2012-09-20 LAB — CBC
MCH: 29.2 pg (ref 26.0–34.0)
MCHC: 33.4 g/dL (ref 30.0–36.0)
MCV: 87.4 fL (ref 78.0–100.0)
Platelets: 192 10*3/uL (ref 150–400)
RBC: 4.38 MIL/uL (ref 3.87–5.11)

## 2012-09-20 MED ORDER — POTASSIUM CHLORIDE CRYS ER 20 MEQ PO TBCR
60.0000 meq | EXTENDED_RELEASE_TABLET | Freq: Once | ORAL | Status: AC
  Administered 2012-09-20: 60 meq via ORAL
  Filled 2012-09-20: qty 3

## 2012-09-20 NOTE — Progress Notes (Signed)
[id number]/Lonney Revak, RN, BSN, CCN: [id number]/ Case management. Chart reviewed for discharge planning and present needs. Discharge needs: none present at time of review. Next chart review due:  [id number] 

## 2012-09-20 NOTE — Progress Notes (Signed)
Physical Therapy Treatment Patient Details Name: Tracy Hicks MRN: [id number] DOB: [birth year] Today's Date: 09/20/2012 Time: 7829-5621 PT Time Calculation (min): 40 min  PT Assessment / Plan / Recommendation  History of Present Illness Tracy Hicks is a 79 y.o. female with PMH significant for dementia, CAD, history of NSTEMI 2011 treated with medical treatment, HTN who was brought to the ED by son due to generalized weakness, decrease appetite, vomiting. Patientvomit the night prior to admission multiple times, fluid content, no blood on it. Patient has being receiving treatment for UTI with ciprofloxacin for last week. Patient usually cough constantly at home for last 7 years. 1 week prior to admission , son notice strong urine odor, and increase urination. Patient was started on ciprofloxacin by PCP for presume UTI. Patient was seen by PCP day prior to admission because of no improvement and patient was started on bactrim.. Increased trop, NSTEMI   PT Comments   Pt with increased assist for transfers due to posterior lean.  Follow Up Recommendations  Home health PT;Supervision/Assistance - 24 hour     Does the patient have the potential to tolerate intense rehabilitation     Barriers to Discharge        Equipment Recommendations  None recommended by PT    Recommendations for Other Services    Frequency     Progress towards PT Goals Progress towards PT goals: Progressing toward goals  Plan Current plan remains appropriate    Precautions / Restrictions Precautions Precautions: Fall Precaution Comments: incontinence Restrictions Weight Bearing Restrictions: No   Pertinent Vitals/Pain n/a    Mobility  Bed Mobility Bed Mobility: Rolling Right;Rolling Left;Supine to Sit Rolling Right: 2: Max assist Rolling Left: 2: Max assist Sit to Supine: 1: +2 Total assist Sit to Supine: Patient Percentage: 20% Details for Bed Mobility Assistance: verbal cues for technique, assist for  weakness Transfers Transfers: Stand to Sit;Sit to Stand;Stand Pivot Transfers Sit to Stand: 1: +2 Total assist;From chair/3-in-1;From bed Sit to Stand: Patient Percentage: 40% (to 50%) Stand to Sit: 1: +2 Total assist;To bed;To chair/3-in-1 Stand to Sit: Patient Percentage: 40% Stand Pivot Transfers: 1: +2 Total assist Stand Pivot Transfers: Patient Percentage: 40% Details for Transfer Assistance: assist for hands on RW, verbal cues for technique and hip extension as well as bringing weight forward as pt with strong posterior lean, performed standing x4 and pivoting x2 (bed -> chair -> bed), pt with BM upon first pivot and then assisted back to bed due to transferring to floor (out of ICU) Ambulation/Gait Ambulation/Gait Assistance: Not tested (comment)    Exercises     PT Diagnosis:    PT Problem List:   PT Treatment Interventions:     PT Goals (current goals can now be found in the care plan section)    Visit Information  Last PT Received On: 09/20/12 Assistance Needed: +2 PT/OT Co-Evaluation/Treatment: Yes History of Present Illness: Tracy Hicks is a 79 y.o. female with PMH significant for dementia, CAD, history of NSTEMI 2011 treated with medical treatment, HTN who was brought to the ED by son due to generalized weakness, decrease appetite, vomiting. Patientvomit the night prior to admission multiple times, fluid content, no blood on it. Patient has being receiving treatment for UTI with ciprofloxacin for last week. Patient usually cough constantly at home for last 7 years. 1 week prior to admission , son notice strong urine odor, and increase urination. Patient was started on ciprofloxacin by PCP for presume UTI. Patient was seen by PCP  day prior to admission because of no improvement and patient was started on bactrim.. Increased trop, NSTEMI    Subjective Data      Cognition  Cognition Arousal/Alertness: Awake/alert Behavior During Therapy: Anxious Overall Cognitive Status:  History of cognitive impairments - at baseline    Balance  Static Sitting Balance Static Sitting - Balance Support: No upper extremity supported Static Sitting - Level of Assistance: 4: Min assist Static Sitting - Comment/# of Minutes: 3 minutes  End of Session PT - End of Session Equipment Utilized During Treatment: Gait belt Activity Tolerance: Patient limited by fatigue Patient left: in bed;with call bell/phone within reach;with family/visitor present;with nursing/sitter in room   GP     Tracy Hicks,KATHrine E 09/20/2012, 4:08 PM Zenovia Jarred, PT, DPT 09/20/2012 Pager: [phone]

## 2012-09-20 NOTE — Progress Notes (Signed)
[facility] PROGRESS NOTE  Tracy Hicks NFA:[id number] DOB: [birth year] DOA: 09/14/2012 PCP: Nadean Corwin, MD   PCCM TRANSFER 7/31  Assessment/Plan 1. Sepsis -Secondary to aspiration pneumonia vs Partially treated UTI -Improved -Urine cultures negative -Blood Cx NGTD -Was on Vanc, Zosyn (4days), now on LEvaquin day 2 to complete course for pneumonia  2. NSTEMI: in setting of sepsis, heart failure. -on medical management  -continue aspirin, plavix, lopressor -Peak troponin was 4 -ECHO with normal EF and no wall motion abnormality  3. Aspiration pneumonia -s/p 4 days of zosyn, now on levaquin as noted above -Swallow eval completed, D2 diet with thin liquids -weaned off O2  4. Renal insufficiency -resolved -in the setting of sepsis etc  5. Acute on chronic diastolic CHF -improved -continue PO lasix, metoprolol -negative 3L -EF 65-70% on last ECHO 7/14  6. Hypokalemia: -replaced  DVT proph: lovenox  Code Status: Partial Code, no CPR.  Family Communication: d/w son at bedside Disposition Plan: transfer to tele  Ambulate, Home with [facility] services soon     Consultants:  [facility] cardiology  Antibiotics: Vanco 7/25>>>7/25  Zosyn 7/25>> 7/29  levaquin 7/29>>>   HPI/Subjective: Feels ok, denies any SoB, eating ok  Objective: Filed Vitals:   09/20/12 0600 09/20/12 0610 09/20/12 0800 09/20/12 1200  BP: 135/52 135/52 128/49   Pulse:      Temp:   98.7 F (37.1 C) 97.7 F (36.5 C)  TempSrc:   Axillary Axillary  Resp: 23  24   Height:      Weight:      SpO2: 93%  92%     Intake/Output Summary (Last 24 hours) at 09/20/12 1311 Last data filed at 09/20/12 0630  Gross per 24 hour  Intake    510 ml  Output    900 ml  Net   -390 ml   Filed Weights   09/18/12 1336 09/19/12 0200 09/20/12 0500  Weight: 64 kg (141 lb 1.5 oz) 63.3 kg (139 lb 8.8 oz) 65.6 kg (144 lb 10 oz)    Exam:   General: aaox3  Cardiovascular:S1S2/RRR  Respiratory:  diminished at bases  Abdomen:soft, Nt, BS present  Musculoskeletal: no edema c/c  Data Reviewed: Basic Metabolic Panel:  Recent Labs Lab 09/15/12 0148 09/16/12 0343 09/17/12 0330 09/18/12 0343 09/20/12 0245  NA 134* 136 138 142 136  K 3.8 3.3* 3.0* 3.3* 2.9*  CL 99 101 97 102 99  CO2 25 26 27 29 26   GLUCOSE 137* 131* 105* 126* 102*  BUN 22 26* 26* 30* 27*  CREATININE 1.19* 1.32* 1.17* 1.24* 1.00  CALCIUM 8.7 8.7 9.3 9.5 8.8  MG  --  2.0 1.9 1.9  --   PHOS  --  2.7 2.0* 2.5  --    Liver Function Tests:  Recent Labs Lab 09/14/12 1204 09/15/12 0148 09/18/12 0343 09/20/12 0245  AST 23 48* 48* 41*  ALT 16 27 60* 47*  ALKPHOS 82 66 120* 93  BILITOT 0.5 0.4 1.0 0.4  PROT 6.9 5.7* 6.5 5.4*  ALBUMIN 3.8 2.9* 2.9* 2.5*    Recent Labs Lab 09/14/12 1204 09/16/12 0343  LIPASE 15 22   No results found for this basename: AMMONIA,  in the last 168 hours CBC:  Recent Labs Lab 09/14/12 1204 09/15/12 0148 09/16/12 0343 09/17/12 0330 09/18/12 0343 09/20/12 0245  WBC 30.1* 20.2* 13.4* 14.4* 13.1* 13.4*  NEUTROABS 29.1*  --   --   --   --   --   HGB 14.6 13.9  14.1 15.1* 15.2* 12.8  HCT 42.0 40.3 41.5 44.4 43.9 38.3  MCV 87.5 87.2 89.2 87.4 86.6 87.4  PLT 274 194 176 164 182 192   Cardiac Enzymes:  Recent Labs Lab 09/14/12 1204 09/14/12 1346 09/14/12 1929 09/15/12 0148 09/18/12 0343  TROPONINI 0.44* 0.45* 1.87* 4.09* 0.98*   BNP (last 3 results)  Recent Labs  09/14/12 1204 09/16/12 0343 09/18/12 0343  PROBNP 3827.0* 8809.0* 4375.0*   CBG: No results found for this basename: GLUCAP,  in the last 168 hours  Recent Results (from the past 240 hour(s))  CULTURE, BLOOD (ROUTINE X 2)     Status: None   Collection Time    09/14/12  1:46 PM      Result Value Range Status   Specimen Description BLOOD RIGHT ARM   Final   Special Requests BOTTLES DRAWN AEROBIC AND ANAEROBIC 5CC   Final   Culture  Setup Time 09/15/2012 00:40   Final   Culture     Final    Value:        BLOOD CULTURE RECEIVED NO GROWTH TO DATE CULTURE WILL BE HELD FOR 5 DAYS BEFORE ISSUING A FINAL NEGATIVE REPORT   Report Status PENDING   Incomplete  CULTURE, BLOOD (ROUTINE X 2)     Status: None   Collection Time    09/14/12  2:04 PM      Result Value Range Status   Specimen Description BLOOD RIGHT HAND   Final   Special Requests BOTTLES DRAWN AEROBIC AND ANAEROBIC [employer]   Final   Culture  Setup Time 09/15/2012 00:40   Final   Culture     Final   Value:        BLOOD CULTURE RECEIVED NO GROWTH TO DATE CULTURE WILL BE HELD FOR 5 DAYS BEFORE ISSUING A FINAL NEGATIVE REPORT   Report Status PENDING   Incomplete  MRSA PCR SCREENING     Status: None   Collection Time    09/14/12  3:20 PM      Result Value Range Status   MRSA by PCR NEGATIVE  NEGATIVE Final   Comment:            The GeneXpert MRSA Assay (FDA     approved for NASAL specimens     only), is one component of a     comprehensive MRSA colonization     surveillance program. It is not     intended to diagnose MRSA     infection nor to guide or     monitor treatment for     MRSA infections.  URINE CULTURE     Status: None   Collection Time    09/14/12  4:39 PM      Result Value Range Status   Specimen Description URINE, CATHETERIZED   Final   Special Requests NONE   Final   Culture  Setup Time 09/14/2012 23:52   Final   Colony Count NO GROWTH   Final   Culture NO GROWTH   Final   Report Status 09/16/2012 FINAL   Final     Studies: No results found.  Scheduled Meds: . antiseptic oral rinse  15 mL Mouth Rinse q12n4p  . clopidogrel  75 mg Oral Q supper  . diltiazem  60 mg Oral Q8H  . donepezil  10 mg Oral QHS  . enoxaparin (LOVENOX) injection  30 mg Subcutaneous Q24H  . feeding supplement  1 Container Oral Q24H  . furosemide  20 mg Oral  Daily  . isosorbide mononitrate  60 mg Oral QHS  . lactose free nutrition  237 mL Oral Q24H  . [START ON 09/21/2012] levofloxacin  750 mg Oral Q48H  . levothyroxine  88 mcg  Oral QAC breakfast  . metoprolol tartrate  25 mg Oral BID  . multivitamin with minerals  1 tablet Oral Daily  . pantoprazole  40 mg Oral Q1200   Continuous Infusions: . sodium chloride 10 mL/hr (09/19/12 0915)    Active Problems:   Sepsis   PNA (pneumonia)   Systolic CHF   Troponin level elevated   Severe sepsis(995.92)   Lactic acid acidosis   Abdominal pain   Acute respiratory failure with hypoxia   Delirium   Acute renal failure    Time spent:    Lakaya Tolen  [facility] Pager [phone] If 7PM-7AM, please contact night-coverage at [email or web address], password [facility] 09/20/2012, 1:11 PM  LOS: 6 days

## 2012-09-20 NOTE — Progress Notes (Signed)
Occupational Therapy Treatment Patient Details Name: Tracy Hicks MRN: [id number] DOB: Dec 10, 1919 Today's Date: 09/20/2012 Time: 7829-5621 OT Time Calculation (min): 44 min  OT Assessment / Plan / Recommendation  History of present illness     OT comments  Pt progressing slowly; +2 for safety as she does have posterior lean  Follow Up Recommendations  SNF;Home health OT (depending upon progress)    Barriers to Discharge       Equipment Recommendations       Recommendations for Other Services    Frequency Min 2X/week   Progress towards OT Goals Progress towards OT goals: Progressing toward goals (slowly)  Plan      Precautions / Restrictions Precautions Precautions: Fall Precaution Comments: incontinence Restrictions Weight Bearing Restrictions: No   Pertinent Vitals/Pain Unable to tell; no grimaces and pt states "oh my God" often with movement    ADL  Toilet Transfer: Simulated;+2 Total assistance pt 40-50% Toilet Transfer Method: Stand pivot Toilet Transfer Equipment:  (bed to chair to bed) Toileting - Clothing Manipulation and Hygiene: Performed;+2 Total assistance Toileting - Clothing Manipulation and Hygiene: Patient Percentage: 0% Where Assessed - Toileting Clothing Manipulation and Hygiene: Rolling right and/or left;Sit to stand from 3-in-1 or toilet Equipment Used: Rolling walker Transfers/Ambulation Related to ADLs: pt tends to lean posteriorly; lost balance sitting eob when turning head to R.   ADL Comments: Incontinent x bowel 3x's during session.  Rolled and performed sit to stand with third person for hygiene.  Pt did better with sit to stand on 2nd and 3rd times.    OT Diagnosis:    OT Problem List:   OT Treatment Interventions:     OT Goals(current goals can now be found in the care plan section)    Visit Information  Last OT Received On: 09/20/12 Assistance Needed: +2 PT/OT Co-Evaluation/Treatment: Yes    Subjective Data      Prior  Functioning       Cognition  Cognition Arousal/Alertness: Awake/alert Behavior During Therapy: Anxious Overall Cognitive Status: History of cognitive impairments - at baseline    Mobility  Bed Mobility Rolling Right: 2: Max assist Rolling Left: 2: Max assist Sit to Supine: 1: +2 Total assist Sit to Supine: Patient Percentage: 30% Transfers Sit to Stand: 1: +2 Total assist Sit to Stand: Patient Percentage: 40% (to 50%) Details for Transfer Assistance: assist for hands on RW.  Also  assist to step feet back.  Turned hips to complete turn for transfers    Exercises      Balance Static Sitting Balance Static Sitting - Balance Support: No upper extremity supported Static Sitting - Level of Assistance: 4: Min assist Static Sitting - Comment/# of Minutes: 3 minutes   End of Session OT - End of Session Activity Tolerance: Patient limited by fatigue Patient left: in bed;with family/visitor present;with call bell/phone within reach;with nursing/sitter in room  GO     Tracy Hicks 09/20/2012, 2:07 PM [name], OTR/L [phone] 09/20/2012

## 2012-09-21 LAB — CBC
Hemoglobin: 12.4 g/dL (ref 12.0–15.0)
MCH: 29.9 pg (ref 26.0–34.0)
MCV: 87 fL (ref 78.0–100.0)
RBC: 4.15 MIL/uL (ref 3.87–5.11)
WBC: 13.3 10*3/uL — ABNORMAL HIGH (ref 4.0–10.5)

## 2012-09-21 LAB — CULTURE, BLOOD (ROUTINE X 2): Culture: NO GROWTH

## 2012-09-21 LAB — BASIC METABOLIC PANEL
CO2: 26 mEq/L (ref 19–32)
Calcium: 8.8 mg/dL (ref 8.4–10.5)
Chloride: 101 mEq/L (ref 96–112)
Glucose, Bld: 114 mg/dL — ABNORMAL HIGH (ref 70–99)
Sodium: 136 mEq/L (ref 135–145)

## 2012-09-21 MED ORDER — ENOXAPARIN SODIUM 40 MG/0.4ML ~~LOC~~ SOLN
40.0000 mg | SUBCUTANEOUS | Status: DC
  Administered 2012-09-22: 40 mg via SUBCUTANEOUS
  Filled 2012-09-21: qty 0.4

## 2012-09-21 MED ORDER — POTASSIUM CHLORIDE CRYS ER 20 MEQ PO TBCR
60.0000 meq | EXTENDED_RELEASE_TABLET | Freq: Once | ORAL | Status: AC
  Administered 2012-09-21: 60 meq via ORAL
  Filled 2012-09-21: qty 3

## 2012-09-21 NOTE — Care Management Note (Addendum)
    Page 1 of 2   09/22/2012     4:36:19 PM   CARE MANAGEMENT NOTE 09/22/2012  Patient:  Tracy, Hicks   Account Number:  [id number]  Date Initiated:  09/14/2012  Documentation initiated by:  DAVIS,RHONDA  Subjective/Objective Assessment:   pt with failed outpt treatment for urosepsis increased in confusion, wbc 36 on admit lactic acid 6.0     Action/Plan:   home with family   Anticipated DC Date:  09/22/2012   Anticipated DC Plan:  HOME W HOME HEALTH SERVICES  In-house referral  NA      DC Planning Services  CM consult      PAC Choice  NA   Choice offered to / List presented to:  C-4 Adult Children   DME arranged  NA      DME agency  NA     HH arranged  HH-1 RN  HH-2 PT      HH agency  [facility]   Status of service:  Completed, signed off Medicare Important Message given?  NA - LOS <3 / Initial given by admissions (If response is "NO", the following Medicare IM given date fields will be blank) Date Medicare IM given:   Date Additional Medicare IM given:    Discharge Disposition:  HOME W HOME HEALTH SERVICES  Per UR Regulation:  Reviewed for med. necessity/level of care/duration of stay  If discussed at Long Length of Stay Meetings, dates discussed:   09/20/2012    Comments:  09/22/12 Priscillia Fouch RN,BSN NCM WEEKEND [phone] [facility] CHOSEN BY SON FOR HH.TC Jann[facility] REP AWARE OF REFERRAL,& HH ORDERS.SON MAY NEED AMBULANCE TRANSP,BUT WILL LET NURSE KNOW.CSW NOTIFIED.  09/21/12 Towana Stenglein RN,BSN NCM [phone] PT-HH.OT-SNF/HH.PROVIDED SONW/HHC AGENCY LIST AS RESOURCE.HE PLEASANTLY DECLINE HHC,& ADAMANTLY DECLINE SNF. BUT WILL TAKE HHC AGENCY LIST HOME & WILL CONTACT PCP IF HE THINKS IT IS NEEDED.HE SAYS HE STAYS WITH PATIENT, &  HAS GOOD SUPPORT W/FAMILY TO ASSIST @ HOME.HE IS ABLE TO TRANSPORT HOME.  [id number] Earlene Plater RN, BSN, [place]: [phone] Case management. Chart reviewed for discharge planning and present needs. Discharge needs: none  present at time of review. Next chart review due:  [id number]   [id number]/Rhonda Earlene Plater RN, BSN, CCN: [phone] Case management. Chart reviewed for discharge planning and present needs. Discharge needs: none present at time of review. Next chart review due:  [id number]   [facility]

## 2012-09-21 NOTE — Progress Notes (Signed)
[facility] PROGRESS NOTE  Tracy Hicks ZOX:[id number] DOB: 07-03-1919 DOA: 09/14/2012 PCP: Nadean Corwin, MD   PCCM TRANSFER 7/31  Assessment/Plan 1. Sepsis -Secondary to aspiration pneumonia vs Partially treated UTI -Improved -Urine cultures negative -Blood Cx NGTD -Was on Vanc, Zosyn (4days), now on Levaquin day 3 to complete course for pneumonia, 2 more days  2. NSTEMI: in setting of sepsis, heart failure. -on medical management  -continue aspirin, plavix, lopressor -Peak troponin was 4 -ECHO with normal EF and no wall motion abnormality  3. Aspiration pneumonia -s/p 4 days of zosyn, now on levaquin as noted above -Swallow eval completed, D2 diet with thin liquids -weaned off O2 -repeat swallow eval today, ? Upgrade diet  4. Renal insufficiency -resolved -in the setting of sepsis etc  5. Acute on chronic diastolic CHF -improved -continue PO lasix, metoprolol -negative 3L -EF 65-70% on last ECHO 7/14  6. Hypokalemia: -replace and repeat  DVT proph: lovenox  Code Status: Partial Code, no CPR.  Family Communication: d/w son at bedside Disposition Plan: home tomorrow if stable, declines SNF  Ambulate, Home with [facility] services soon     Consultants:  [facility] cardiology  Antibiotics: Vanco 7/25>>>7/25  Zosyn 7/25>> 7/29  levaquin 7/29>>>   HPI/Subjective: Feels ok, denies any SoB, eating poorly, sons wants diet upgraded  Objective: Filed Vitals:   09/20/12 1200 09/20/12 1635 09/20/12 2036 09/21/12 0500  BP: 122/42 149/75 149/50 152/53  Pulse:  61 64 67  Temp: 97.7 F (36.5 C) 98.5 F (36.9 C) 97.9 F (36.6 C) 98.6 F (37 C)  TempSrc: Axillary Oral Oral Oral  Resp: 23 18 20 20   Height:  5\' 5"  (1.651 m)    Weight:  65.6 kg (144 lb 10 oz)    SpO2: 94% 90% 96% 93%    Intake/Output Summary (Last 24 hours) at 09/21/12 1300 Last data filed at 09/21/12 0900  Gross per 24 hour  Intake    720 ml  Output    500 ml  Net    220 ml   Filed  Weights   09/19/12 0200 09/20/12 0500 09/20/12 1635  Weight: 63.3 kg (139 lb 8.8 oz) 65.6 kg (144 lb 10 oz) 65.6 kg (144 lb 10 oz)    Exam:   General: aaox3  Cardiovascular:S1S2/RRR  Respiratory: diminished at bases  Abdomen:soft, Nt, BS present  Musculoskeletal: no edema c/c  Data Reviewed: Basic Metabolic Panel:  Recent Labs Lab 09/15/12 0148 09/16/12 0343 09/17/12 0330 09/18/12 0343 09/20/12 0245 09/21/12 0435  NA 134* 136 138 142 136 136  K 3.8 3.3* 3.0* 3.3* 2.9* 3.3*  CL 99 101 97 102 99 101  CO2 25 26 27 29 26 26   GLUCOSE 137* 131* 105* 126* 102* 114*  BUN 22 26* 26* 30* 27* 19  CREATININE 1.19* 1.32* 1.17* 1.24* 1.00 0.93  CALCIUM 8.7 8.7 9.3 9.5 8.8 8.8  MG  --  2.0 1.9 1.9  --   --   PHOS  --  2.7 2.0* 2.5  --   --    Liver Function Tests:  Recent Labs Lab 09/15/12 0148 09/18/12 0343 09/20/12 0245  AST 48* 48* 41*  ALT 27 60* 47*  ALKPHOS 66 120* 93  BILITOT 0.4 1.0 0.4  PROT 5.7* 6.5 5.4*  ALBUMIN 2.9* 2.9* 2.5*    Recent Labs Lab 09/16/12 0343  LIPASE 22   No results found for this basename: AMMONIA,  in the last 168 hours CBC:  Recent Labs Lab 09/16/12 0343 09/17/12  0330 09/18/12 0343 09/20/12 0245 09/21/12 0435  WBC 13.4* 14.4* 13.1* 13.4* 13.3*  HGB 14.1 15.1* 15.2* 12.8 12.4  HCT 41.5 44.4 43.9 38.3 36.1  MCV 89.2 87.4 86.6 87.4 87.0  PLT 176 164 182 192 242   Cardiac Enzymes:  Recent Labs Lab 09/14/12 1346 09/14/12 1929 09/15/12 0148 09/18/12 0343  TROPONINI 0.45* 1.87* 4.09* 0.98*   BNP (last 3 results)  Recent Labs  09/14/12 1204 09/16/12 0343 09/18/12 0343  PROBNP 3827.0* 8809.0* 4375.0*   CBG: No results found for this basename: GLUCAP,  in the last 168 hours  Recent Results (from the past 240 hour(s))  CULTURE, BLOOD (ROUTINE X 2)     Status: None   Collection Time    09/14/12  1:46 PM      Result Value Range Status   Specimen Description BLOOD RIGHT ARM   Final   Special Requests BOTTLES  DRAWN AEROBIC AND ANAEROBIC 5CC   Final   Culture  Setup Time 09/15/2012 00:40   Final   Culture NO GROWTH 5 DAYS   Final   Report Status 09/21/2012 FINAL   Final  CULTURE, BLOOD (ROUTINE X 2)     Status: None   Collection Time    09/14/12  2:04 PM      Result Value Range Status   Specimen Description BLOOD RIGHT HAND   Final   Special Requests BOTTLES DRAWN AEROBIC AND ANAEROBIC [facility]   Final   Culture  Setup Time 09/15/2012 00:40   Final   Culture NO GROWTH 5 DAYS   Final   Report Status 09/21/2012 FINAL   Final  MRSA PCR SCREENING     Status: None   Collection Time    09/14/12  3:20 PM      Result Value Range Status   MRSA by PCR NEGATIVE  NEGATIVE Final   Comment:            The GeneXpert MRSA Assay (FDA     approved for NASAL specimens     only), is one component of a     comprehensive MRSA colonization     surveillance program. It is not     intended to diagnose MRSA     infection nor to guide or     monitor treatment for     MRSA infections.  URINE CULTURE     Status: None   Collection Time    09/14/12  4:39 PM      Result Value Range Status   Specimen Description URINE, CATHETERIZED   Final   Special Requests NONE   Final   Culture  Setup Time 09/14/2012 23:52   Final   Colony Count NO GROWTH   Final   Culture NO GROWTH   Final   Report Status 09/16/2012 FINAL   Final     Studies: No results found.  Scheduled Meds: . antiseptic oral rinse  15 mL Mouth Rinse q12n4p  . clopidogrel  75 mg Oral Q supper  . diltiazem  60 mg Oral Q8H  . donepezil  10 mg Oral QHS  . [START ON 09/22/2012] enoxaparin (LOVENOX) injection  40 mg Subcutaneous Q24H  . feeding supplement  1 Container Oral Q24H  . furosemide  20 mg Oral Daily  . isosorbide mononitrate  60 mg Oral QHS  . lactose free nutrition  237 mL Oral Q24H  . levofloxacin  750 mg Oral Q48H  . levothyroxine  88 mcg Oral QAC breakfast  . metoprolol  tartrate  25 mg Oral BID  . multivitamin with minerals  1 tablet Oral  Daily  . pantoprazole  40 mg Oral Q1200   Continuous Infusions: . sodium chloride 10 mL/hr (09/19/12 0915)    Active Problems:   Sepsis   PNA (pneumonia)   Systolic CHF   Troponin level elevated   Severe sepsis(995.92)   Lactic acid acidosis   Abdominal pain   Acute respiratory failure with hypoxia   Delirium   Acute renal failure    Time spent:    Caley Ciaramitaro  [facility] Pager [phone] If 7PM-7AM, please contact night-coverage at [email or web address], password [facility] 09/21/2012, 1:00 PM  LOS: 7 days

## 2012-09-21 NOTE — Progress Notes (Signed)
Brief follow up with pt and son, Billey Gosling, at bedside.  Provided brief support around discharge.    Belva Crome MDiv

## 2012-09-22 DIAGNOSIS — A419 Sepsis, unspecified organism: Principal | ICD-10-CM

## 2012-09-22 MED ORDER — LEVOFLOXACIN 500 MG PO TABS: 500.0000 mg | ORAL_TABLET | Freq: Every day | ORAL | Status: DC

## 2012-09-22 MED ORDER — DILTIAZEM HCL ER COATED BEADS 120 MG PO CP24: 120.0000 mg | ORAL_CAPSULE | Freq: Every day | ORAL | Status: DC

## 2012-09-22 MED ORDER — FUROSEMIDE 20 MG PO TABS: 20.0000 mg | ORAL_TABLET | Freq: Every day | ORAL | Status: DC

## 2012-09-22 MED ORDER — LOPERAMIDE HCL 2 MG PO CAPS
2.0000 mg | ORAL_CAPSULE | ORAL | Status: DC | PRN
  Administered 2012-09-22: 2 mg via ORAL
  Filled 2012-09-22: qty 1

## 2012-09-22 MED ORDER — LOPERAMIDE HCL 2 MG PO CAPS: 2.0000 mg | ORAL_CAPSULE | ORAL | Status: DC | PRN

## 2012-09-22 MED ORDER — METOPROLOL TARTRATE 25 MG PO TABS: 25.0000 mg | ORAL_TABLET | Freq: Two times a day (BID) | ORAL | Status: DC

## 2012-09-22 MED ORDER — ISOSORBIDE MONONITRATE ER 30 MG PO TB24: 30.0000 mg | ORAL_TABLET | Freq: Every day | ORAL | Status: DC

## 2012-09-22 NOTE — Progress Notes (Signed)
Pt rested quietly with c/o throughout night. turned q 2hrs. Pt was incont of urine. Cleaned and repositioned.cont with plan of care

## 2012-09-22 NOTE — Evaluation (Signed)
Clinical/Bedside Swallow Evaluation Patient Details  Name: Tracy Hicks MRN: [id number] Date of Birth: [birth year]  Today's Date: 09/22/2012 Time: 4098-1191 SLP Time Calculation (min): 11 min  Past Medical History:  Past Medical History  Diagnosis Date  . Hypertension   . Dementia   . Hyperlipidemia   . Coronary artery disease   . STEMI (ST elevation myocardial infarction)   . Hyperlipidemia   . Stroke    Past Surgical History: History reviewed. No pertinent past surgical history. HPI:  79 y.o. female with PMH significant for dementia, CAD, CVA, history of NSTEMI 2011 treated with medical treatment, HTN who was brought to the ED by son due to generalized weakness, decrease appetite, vomiting. Pt found to have UTI. BSE revealed no signs of aspiration but pt weak, SLP recommended Dys 2 diet witht hin liqudis. D/c pending, MD reordered for diet upgrade.    Assessment / Plan / Recommendation Clinical Impression  Today pt demonstrated slow, but functional consumption of regular solid texture, sutaining attention to snack appropriately. Likely age related delay in swallow initiation also present. No evidence of aspiration. Given that foods of choice are important to pt and family, recommend upgrade to regular diet and thin liquids. Discussed foods to avoid with son. No SLP f/u warranted at this time.     Aspiration Risk  Mild    Diet Recommendation Regular;Thin liquid   Liquid Administration via: Cup;Straw Medication Administration: Whole meds with puree Supervision: Full supervision/cueing for compensatory strategies Compensations: Slow rate;Small sips/bites Postural Changes and/or Swallow Maneuvers: Seated upright 90 degrees;Upright 30-60 min after meal    Other  Recommendations Oral Care Recommendations: Oral care BID   Follow Up Recommendations  None    Frequency and Duration        Pertinent Vitals/Pain NA    SLP Swallow Goals     Swallow Study Prior Functional  Status       General HPI: 79 y.o. female with PMH significant for dementia, CAD, CVA, history of NSTEMI 2011 treated with medical treatment, HTN who was brought to the ED by son due to generalized weakness, decrease appetite, vomiting. Pt found to have UTI. BSE revealed no signs of aspiration but pt weak, SLP recommended Dys 2 diet witht hin liqudis. D/c pending, MD reordered for diet upgrade.  Type of Study: Bedside swallow evaluation Previous Swallow Assessment: BSE7/26 -Dys 2/thin Diet Prior to this Study: Dysphagia 2 (chopped);Thin liquids Temperature Spikes Noted: No Respiratory Status: Room air History of Recent Intubation: No Behavior/Cognition: Alert;Cooperative;Pleasant mood Oral Cavity - Dentition: Dentures, top;Dentures, bottom Self-Feeding Abilities: Able to feed self Patient Positioning: Upright in chair Baseline Vocal Quality: Clear Volitional Cough: Strong Volitional Swallow: Able to elicit    Oral/Motor/Sensory Function Overall Oral Motor/Sensory Function: Appears within functional limits for tasks assessed   Ice Chips     Thin Liquid Thin Liquid: Within functional limits    Nectar Thick     Honey Thick     Puree Puree: Within functional limits   Solid   GO    Solid: Within functional limits       Tracy Hicks, Tracy Hicks 09/22/2012,1:10 PM

## 2012-09-22 NOTE — Progress Notes (Signed)
Assumed care from off going RN. Nop change in shift assessment. Cont with plan of care

## 2012-09-22 NOTE — Progress Notes (Signed)
Discharge instructions and prescriptions reviewed  Tracy Hicks (son) no questions at this time teach back method utilized.

## 2012-09-22 NOTE — Progress Notes (Signed)
Physical Therapy Treatment Patient Details Name: Tracy Hicks MRN: [id number] DOB: 1919/06/11 Today's Date: 09/22/2012 Time: 4098-1191 PT Time Calculation (min): 29 min  PT Assessment / Plan / Recommendation  History of Present Illness     PT Comments   Spoke to son re: pt's limitations for current mobility. Son reports he will manage. Pt has been limited in ability to progress due to dementia. Recommend trial of HHPT as pt was ambulatory PTA.  Follow Up Recommendations  Home health PT;Supervision/Assistance - 24 hour     Does the patient have the potential to tolerate intense rehabilitation     Barriers to Discharge        Equipment Recommendations  None recommended by PT    Recommendations for Other Services    Frequency Min 3X/week   Progress towards PT Goals Progress towards PT goals: Not progressing toward goals - comment  Plan Current plan remains appropriate    Precautions / Restrictions Precautions Precautions: Fall Precaution Comments: incontinence   Pertinent Vitals/Pain Just yells out when moved.    Mobility  Bed Mobility Bed Mobility: Rolling Right;Rolling Left;Supine to Sit Rolling Right: 1: +2 Total assist Rolling Right: Patient Percentage: 10% Rolling Left: 1: +2 Total assist Rolling Left: Patient Percentage: 10% Details for Bed Mobility Assistance: pt has been discharged. sons had gotten pt up earlier.    Exercises     PT Diagnosis:    PT Problem List:   PT Treatment Interventions:     PT Goals (current goals can now be found in the care plan section)    Visit Information  Last PT Received On: 09/22/12 Assistance Needed: +2    Subjective Data      Cognition  Cognition Arousal/Alertness: Lethargic Behavior During Therapy: Anxious Overall Cognitive Status: History of cognitive impairments - at baseline    Balance     End of Session PT - End of Session Activity Tolerance: Patient limited by lethargy;Treatment limited secondary to  agitation Patient left: in bed;with family/visitor present Nurse Communication: Mobility status   GP     Rada Hay 09/22/2012, 4:18 PM

## 2012-10-17 NOTE — Discharge Summary (Signed)
Physician Discharge Summary  Tracy Hicks ZHY:[id number] DOB: 1919-08-15 DOA: 09/14/2012  PCP: Nadean Corwin, MD  Admit date: 09/14/2012 Discharge date: 09/22/2012  Time spent: 50 minutes  Recommendations for Outpatient Follow-up:  1. PCP in 1 week  Discharge Diagnoses:  Active Problems:   Sepsis   Aspiration PNA (pneumonia)   Systolic CHF   Troponin level elevated   NSTEMI   Severe sepsis(995.92)   Lactic acid acidosis   Abdominal pain   Acute respiratory failure with hypoxia   Delirium   Acute renal failure   Acute on chronic Diastolic CHF  Discharge Condition: improved  Diet recommendation: low sodium, regular diet, thin liquids, aspiration precautions  Filed Weights   09/20/12 0500 09/20/12 1635 09/22/12 0700  Weight: 65.6 kg (144 lb 10 oz) 65.6 kg (144 lb 10 oz) 60.419 kg (133 lb 3.2 oz)    History of present illness:  Tracy Hicks is a 79 y.o. female with PMH significant for dementia, CAD, history of NSTEMI 2011 treated with medical treatment, HTN who was brought to the ED by son due to generalized weakness, decrease appetite, vomiting. Patient vomit the night prior to admission multiple times, fluid content, no blood on it. Patient has being receiving treatment for UTI with ciprofloxacin for last week. Patient usually cough constantly at home for last 7 years. 1 week prior to admission , son notice strong urine odor, and increase urination. Patient was started on ciprofloxacin by PCP for presume UTI. Patient was seen by PCP day prior to admission because of no improvement and patient was started on bactrim.  Patient was found to have WBC at 30,000, lactic acid at 6, increase BNP 3827, troponin at 0.44, Chest x ray with Mild central pulmonary vascular congestion is noted with probable mild bilateral perihilar edema.   Hospital Course:  1. Sepsis  -Secondary to aspiration pneumonia vs Partially treated UTI  -Improved  -Urine cultures negative  -Blood Cx NGTD   -Was on Vanc, Zosyn (4days), now on Levaquin day 3 to complete course for pneumonia, 1 more day   2. NSTEMI: in setting of sepsis, heart failure.  -on medical management  -continue aspirin, plavix, lopressor  -Peak troponin was 4  -ECHO with normal EF and no wall motion abnormality   3. Aspiration pneumonia  -s/p 4 days of zosyn, now on levaquin as noted above  -Swallow eval completed, D2 diet with thin liquids recommended but po intake very poor on this diet and hence was re-evalued by speech prior to discharge and diet was upgraded with strict aspiration precautions -weaned off O2   4. Renal insufficiency  -resolved  -in the setting of sepsis etc   5. Acute on chronic diastolic CHF  -improved, diuresed with IV lasix initially  -continue PO lasix, metoprolol  -negative 3L  -EF 65-70% on last ECHO 7/14   6. Hypokalemia:  -replaced     Consultations:  Pulm Critical care     Discharge Exam: Filed Vitals:   09/22/12 1501  BP: 134/47  Pulse: 63  Temp: 98.3 F (36.8 C)  Resp: 18    General: AAO, frail, elderly female Cardiovascular: S1S2/RRR Respiratory: diminished at bases  Discharge Instructions  Discharge Orders   Future Orders Complete By Expires   Diet - low sodium heart healthy  As directed    Increase activity slowly  As directed        Medication List    STOP taking these medications       amLODipine-benazepril 10-20 MG  per capsule  Commonly known as:  LOTREL     ciprofloxacin 250 MG tablet  Commonly known as:  CIPRO     sulfamethoxazole-trimethoprim 800-160 MG per tablet  Commonly known as:  BACTRIM DS      TAKE these medications       cholecalciferol 1000 UNITS tablet  Commonly known as:  VITAMIN D  Take 2,000 Units by mouth daily.     clopidogrel 75 MG tablet  Commonly known as:  PLAVIX  Take 75 mg by mouth daily with supper.     diltiazem 120 MG 24 hr capsule  Commonly known as:  CARDIZEM CD  Take 1 capsule (120 mg total) by  mouth daily.     donepezil 10 MG tablet  Commonly known as:  ARICEPT  Take 10 mg by mouth at bedtime.     fexofenadine 180 MG tablet  Commonly known as:  ALLEGRA  Take 180 mg by mouth daily.     furosemide 20 MG tablet  Commonly known as:  LASIX  Take 1 tablet (20 mg total) by mouth daily.     isosorbide mononitrate 30 MG 24 hr tablet  Commonly known as:  IMDUR  Take 1 tablet (30 mg total) by mouth daily. 30 mg at supper time     levofloxacin 500 MG tablet  Commonly known as:  LEVAQUIN  Take 1 tablet (500 mg total) by mouth daily. For 2 days     levothyroxine 88 MCG tablet  Commonly known as:  SYNTHROID, LEVOTHROID  Take 88 mcg by mouth daily before breakfast.     loperamide 2 MG capsule  Commonly known as:  IMODIUM  Take 1 capsule (2 mg total) by mouth as needed for diarrhea or loose stools.     metoprolol tartrate 25 MG tablet  Commonly known as:  LOPRESSOR  Take 1 tablet (25 mg total) by mouth 2 (two) times daily. 12.5 in the morning and 25 mg at bedtime     nitroGLYCERIN 0.4 MG SL tablet  Commonly known as:  NITROSTAT  Place 0.4 mg under the tongue every 5 (five) minutes as needed for chest pain.     pantoprazole 40 MG tablet  Commonly known as:  PROTONIX  Take 40 mg by mouth daily.     prochlorperazine 5 MG tablet  Commonly known as:  COMPAZINE  Take 5 mg by mouth every 8 (eight) hours as needed for nausea.     tolterodine 2 MG tablet  Commonly known as:  DETROL  Take 4 mg by mouth 2 (two) times daily.       Allergies  Allergen Reactions  . Bactrim [Sulfamethoxazole W-Trimethoprim]        Follow-up Information   Follow up with MCKEOWN,WILLIAM DAVID, MD. Schedule an appointment as soon as possible for a visit in 1 week.   Specialty:  Internal Medicine   Contact information:   [address] [place] [address] [phone]        The results of significant diagnostics from this hospitalization (including imaging, microbiology,  ancillary and laboratory) are listed below for reference.    Significant Diagnostic Studies: Dg Chest Port 1 View  09/18/2012   *RADIOLOGY REPORT*  Clinical Data: Follow up airspace disease.  PORTABLE CHEST - 1 VIEW  Comparison: 09/17/2012.  Findings: There is little change in the chest radiograph. Cardiopericardial silhouette appears unchanged with dense mitral annular calcification.  Diffuse bilateral interstitial and airspace opacity is present, likely representing pulmonary edema. Superimposed infection  cannot be excluded. Monitoring leads are projected over the chest.  Aortic arch atherosclerosis.  IMPRESSION: No interval change.   Original Report Authenticated By: Andreas Newport, M.D.    Microbiology: No results found for this or any previous visit (from the past 240 hour(s)).   Labs: Basic Metabolic Panel: No results found for this basename: NA, K, CL, CO2, GLUCOSE, BUN, CREATININE, CALCIUM, MG, PHOS,  in the last 168 hours Liver Function Tests: No results found for this basename: AST, ALT, ALKPHOS, BILITOT, PROT, ALBUMIN,  in the last 168 hours No results found for this basename: LIPASE, AMYLASE,  in the last 168 hours No results found for this basename: AMMONIA,  in the last 168 hours CBC: No results found for this basename: WBC, NEUTROABS, HGB, HCT, MCV, PLT,  in the last 168 hours Cardiac Enzymes: No results found for this basename: CKTOTAL, CKMB, CKMBINDEX, TROPONINI,  in the last 168 hours BNP: BNP (last 3 results)  Recent Labs  09/14/12 1204 09/16/12 0343 09/18/12 0343  PROBNP 3827.0* 8809.0* 4375.0*   CBG: No results found for this basename: GLUCAP,  in the last 168 hours     Signed:  Jimie Kuwahara  [facility] 10/17/2012, 1:42 PM

## 2013-01-09 ENCOUNTER — Telehealth: Payer: Self-pay | Admitting: *Deleted

## 2013-01-09 MED ORDER — BACITRACIN-POLYMYXIN B 500-10000 UNIT/GM OP OINT: 1.0000 "application " | TOPICAL_OINTMENT | Freq: Three times a day (TID) | OPHTHALMIC | Status: DC

## 2013-01-09 NOTE — Telephone Encounter (Signed)
DOB   [birth year]  Pt's son calling says this has happened several times where pt cont. To scratch eye to its sore & red. Leonette Most wants to know can we review her chart & possibly call in what has been called before? If so call to [facility] [place] [phone]  * please advise*

## 2013-02-01 ENCOUNTER — Encounter: Payer: Self-pay | Admitting: Internal Medicine

## 2013-02-01 ENCOUNTER — Other Ambulatory Visit: Payer: Self-pay | Admitting: Emergency Medicine

## 2013-02-01 DIAGNOSIS — F0390 Unspecified dementia without behavioral disturbance: Secondary | ICD-10-CM | POA: Insufficient documentation

## 2013-02-01 DIAGNOSIS — E785 Hyperlipidemia, unspecified: Secondary | ICD-10-CM | POA: Insufficient documentation

## 2013-02-01 DIAGNOSIS — K219 Gastro-esophageal reflux disease without esophagitis: Secondary | ICD-10-CM | POA: Insufficient documentation

## 2013-02-01 DIAGNOSIS — I10 Essential (primary) hypertension: Secondary | ICD-10-CM | POA: Insufficient documentation

## 2013-02-01 DIAGNOSIS — I2510 Atherosclerotic heart disease of native coronary artery without angina pectoris: Secondary | ICD-10-CM | POA: Insufficient documentation

## 2013-02-01 DIAGNOSIS — E039 Hypothyroidism, unspecified: Secondary | ICD-10-CM | POA: Insufficient documentation

## 2013-02-01 MED ORDER — OFLOXACIN 0.3 % OP SOLN: 1.0000 [drp] | Freq: Four times a day (QID) | OPHTHALMIC | Status: DC

## 2013-02-06 ENCOUNTER — Encounter: Payer: Self-pay | Admitting: Internal Medicine

## 2013-02-06 NOTE — Progress Notes (Signed)
Patient ID: Tracy Hicks, female   DOB: 1919-10-10, 79 y.o.   MRN: [id number]   This very nice79 yo WWF presents for 3 month follow up with Hypertension, ASHD, GERD, Hypothyroidism, Hyperlipidemia, SDAT,  Pre-Diabetes and Vitamin D Deficiency.    BP has been controlled at home. Today's BP is    . Patient denies any cardiac type chest pain, palpitations, dyspnea/orthopnea/PND, dizziness, claudication, or dependent edema.   Hyperlipidemia is controlled with diet & meds. Last Cholesterol was  , Triglycerides were    , HDL   , and LDL    . Patient denies myalgias or other med SE's.    Also, the patient has history of PreDiabetes/insulin resistance with last A1c of    . Patient denies any symptoms of reactive hypoglycemia, diabetic polys, paresthesias or visual blurring.   Further, Patient has history of Vitamin D Deficiency with last vitamin D of   . Patient supplements vitamin D without any suspected side-effects.  Current Outpatient Prescriptions on File Prior to Visit  Medication Sig Dispense Refill  . bacitracin-polymyxin b (POLYSPORIN) ophthalmic ointment Place 1 application into both eyes 3 (three) times daily. apply to eye every 12 hours while awake  3.5 g  0  . cholecalciferol (VITAMIN D) 1000 UNITS tablet Take 2,000 Units by mouth daily.      . clopidogrel (PLAVIX) 75 MG tablet Take 75 mg by mouth daily with supper.      . diltiazem (CARDIZEM CD) 120 MG 24 hr capsule Take 1 capsule (120 mg total) by mouth daily.  30 capsule  0  . donepezil (ARICEPT) 10 MG tablet Take 10 mg by mouth at bedtime.      . fexofenadine (ALLEGRA) 180 MG tablet Take 180 mg by mouth daily.      . furosemide (LASIX) 20 MG tablet Take 1 tablet (20 mg total) by mouth daily.  30 tablet  0  . isosorbide mononitrate (IMDUR) 30 MG 24 hr tablet Take 1 tablet (30 mg total) by mouth daily. 30 mg at supper time      . levofloxacin (LEVAQUIN) 500 MG tablet Take 1 tablet (500 mg total) by mouth daily. For 2 days  4 tablet  0  .  levothyroxine (SYNTHROID, LEVOTHROID) 88 MCG tablet Take 88 mcg by mouth daily before breakfast.      . loperamide (IMODIUM) 2 MG capsule Take 1 capsule (2 mg total) by mouth as needed for diarrhea or loose stools.  10 capsule  0  . metoprolol tartrate (LOPRESSOR) 25 MG tablet Take 1 tablet (25 mg total) by mouth 2 (two) times daily. 12.5 in the morning and 25 mg at bedtime      . nitroGLYCERIN (NITROSTAT) 0.4 MG SL tablet Place 0.4 mg under the tongue every 5 (five) minutes as needed for chest pain.      Marland Kitchen ofloxacin (OCUFLOX) 0.3 % ophthalmic solution Place 1 drop into both eyes 4 (four) times daily.  10 mL  1  . pantoprazole (PROTONIX) 40 MG tablet Take 40 mg by mouth daily.      . prochlorperazine (COMPAZINE) 5 MG tablet Take 5 mg by mouth every 8 (eight) hours as needed for nausea.      Marland Kitchen tolterodine (DETROL) 2 MG tablet Take 4 mg by mouth 2 (two) times daily.       No current facility-administered medications on file prior to visit.     Allergies  Allergen Reactions  . Bactrim [Sulfamethoxazole-Trimethoprim]     PMHx:  Past Medical History  Diagnosis Date  . Hypertension   . Dementia   . Hyperlipidemia   . Coronary artery disease   . STEMI (ST elevation myocardial infarction)   . Hyperlipidemia   . Stroke   . GERD (gastroesophageal reflux disease)   . Hypothyroid   . ASHD (arteriosclerotic heart disease)     FHx:    Reviewed / unchanged  SHx:    Reviewed / unchanged  Systems Review: Constitutional: Denies fever, chills, wt changes, headaches, insomnia, fatigue, night sweats, change in appetite. Eyes: Denies redness, blurred vision, diplopia, discharge, itchy, watery eyes.  ENT: Denies discharge, congestion, post nasal drip, epistaxis, sore throat, earache, hearing loss, dental pain, tinnitus, vertigo, sinus pain, snoring.  CV: Denies chest pain, palpitations, irregular heartbeat, syncope, dyspnea, diaphoresis, orthopnea, PND, claudication, edema. Respiratory: denies  cough, dyspnea, DOE, pleurisy, hoarseness, laryngitis, wheezing.  Gastrointestinal: Denies dysphagia, odynophagia, heartburn, reflux, water brash, abdominal pain or cramps, nausea, vomiting, bloating, diarrhea, constipation, hematemesis, melena, hematochezia,  or hemorrhoids. Genitourinary: Denies dysuria, frequency, urgency, nocturia, hesitancy, discharge, hematuria, flank pain. Musculoskeletal: Denies arthralgias, myalgias, stiffness, jt. swelling, pain, limp, strain/sprain.  Skin: Denies pruritus, rash, hives, warts, acne, eczema, change in skin lesion(s). Neuro: No weakness, tremor, incoordination, spasms, paresthesia, or pain. Psychiatric: Denies confusion, memory loss, or sensory loss. Endo: Denies change in weight, skin, hair change.  Heme/Lymph: No excessive bleeding, bruising, orenlarged lymph nodes.  There were no vitals filed for this visit.  Estimated body mass index is 22.17 kg/(m^2) as calculated from the following:   Height as of 09/20/12: 5\' 5"  (1.651 m).   Weight as of 09/14/12: 133 lb 3.2 oz (60.419 kg).  On Exam: Appears well nourished - in no distress. Eyes: PERRLA, EOMs, conjunctiva no swelling or erythema. Sinuses: No frontal/maxillary tenderness ENT/Mouth: EAC's clear, TM's nl w/o erythema, bulging. Nares clear w/o erythema, swelling, exudates. Oropharynx clear without erythema or exudates. Oral hygiene is good. Tongue normal, non obstructing. Hearing intact.  Neck: Supple. Thyroid nl. Car 2+/2+ without bruits, nodes or JVD. Chest: Respirations nl with BS clear & equal w/o rales, rhonchi, wheezing or stridor.  Cor: Heart sounds normal w/ regular rate and rhythm without sig. murmurs, gallops, clicks, or rubs. Peripheral pulses normal and equal  without edema.  Abdomen: Soft & bowel sounds normal. Non-tender w/o guarding, rebound, hernias, masses, or organomegaly.  Lymphatics: Unremarkable.  Musculoskeletal: Full ROM all peripheral extremities, joint stability, 5/5  strength, and normal gait.  Skin: Warm, dry without exposed rashes, lesions, ecchymosis apparent.  Neuro: Cranial nerves intact, reflexes equal bilaterally. Sensory-motor testing grossly intact. Tendon reflexes grossly intact.  Pysch: Alert & oriented x 3. Insight and judgement nl & appropriate. No ideations.  Assessment and Plan:  1. Hypertension - Continue monitor blood pressure at home. Continue diet/meds same.  2. Hyperlipidemia - Continue diet/meds, exercise,& lifestyle modifications. Continue monitor periodic cholesterol/liver & renal functions   3. Pre-diabetes/Insulin Resistance - Continue diet, exercise, lifestyle modifications. Monitor appropriate labs.  3. Diabetes - continue recommend prudent low glycemic diet, weight control, regular exercise, diabetic monitoring and periodic eye exams.  4. Vitamin D Deficiency - Continue supplementation.  Recommended regular exercise, BP monitoring, weight control, and discussed med and SE's. Recommended labs to assess and monitor clinical status. Further disposition pending results of labs.  This encounter was created in error - please disregard.

## 2013-02-13 ENCOUNTER — Encounter: Payer: Self-pay | Admitting: Emergency Medicine

## 2013-02-13 ENCOUNTER — Ambulatory Visit (INDEPENDENT_AMBULATORY_CARE_PROVIDER_SITE_OTHER): Payer: Medicare Other | Admitting: Emergency Medicine

## 2013-02-13 VITALS — BP 116/60 | HR 64 | Temp 98.0°F | Resp 16

## 2013-02-13 DIAGNOSIS — I10 Essential (primary) hypertension: Secondary | ICD-10-CM

## 2013-02-13 DIAGNOSIS — E559 Vitamin D deficiency, unspecified: Secondary | ICD-10-CM

## 2013-02-13 DIAGNOSIS — B379 Candidiasis, unspecified: Secondary | ICD-10-CM

## 2013-02-13 DIAGNOSIS — E782 Mixed hyperlipidemia: Secondary | ICD-10-CM

## 2013-02-13 DIAGNOSIS — R7309 Other abnormal glucose: Secondary | ICD-10-CM

## 2013-02-13 LAB — HEMOGLOBIN A1C
Hgb A1c MFr Bld: 5.7 % — ABNORMAL HIGH (ref <5.7)
Mean Plasma Glucose: 117 mg/dL — ABNORMAL HIGH (ref <117)

## 2013-02-13 LAB — HEPATIC FUNCTION PANEL
ALT: 13 U/L (ref 0–35)
AST: 16 U/L (ref 0–37)
Albumin: 4 g/dL (ref 3.5–5.2)
Alkaline Phosphatase: 72 U/L (ref 39–117)
Indirect Bilirubin: 0.5 mg/dL (ref 0.0–0.9)
Total Bilirubin: 0.6 mg/dL (ref 0.3–1.2)

## 2013-02-13 LAB — BASIC METABOLIC PANEL WITH GFR
BUN: 11 mg/dL (ref 6–23)
CO2: 31 mEq/L (ref 19–32)
Calcium: 9.6 mg/dL (ref 8.4–10.5)
Chloride: 100 mEq/L (ref 96–112)
Creat: 1.09 mg/dL (ref 0.50–1.10)
GFR, Est African American: 51 mL/min — ABNORMAL LOW
GFR, Est Non African American: 44 mL/min — ABNORMAL LOW
Glucose, Bld: 113 mg/dL — ABNORMAL HIGH (ref 70–99)
Potassium: 4.5 mEq/L (ref 3.5–5.3)
Sodium: 142 mEq/L (ref 135–145)

## 2013-02-13 LAB — CBC WITH DIFFERENTIAL/PLATELET
Basophils Absolute: 0 10*3/uL (ref 0.0–0.1)
Basophils Relative: 0 % (ref 0–1)
Eosinophils Absolute: 0.2 10*3/uL (ref 0.0–0.7)
Eosinophils Relative: 2 % (ref 0–5)
Hemoglobin: 13.5 g/dL (ref 12.0–15.0)
MCH: 30.1 pg (ref 26.0–34.0)
MCHC: 35.2 g/dL (ref 30.0–36.0)
MCV: 85.3 fL (ref 78.0–100.0)
Monocytes Absolute: 0.7 10*3/uL (ref 0.1–1.0)
Neutro Abs: 6.6 10*3/uL (ref 1.7–7.7)
Neutrophils Relative %: 72 % (ref 43–77)
Platelets: 252 10*3/uL (ref 150–400)
RBC: 4.49 MIL/uL (ref 3.87–5.11)
RDW: 14.8 % (ref 11.5–15.5)

## 2013-02-13 LAB — LIPID PANEL
Cholesterol: 212 mg/dL — ABNORMAL HIGH (ref 0–200)
HDL: 47 mg/dL (ref >39)
LDL Cholesterol: 119 mg/dL — ABNORMAL HIGH (ref 0–99)
Total CHOL/HDL Ratio: 4.5 Ratio
Triglycerides: 229 mg/dL — ABNORMAL HIGH (ref <150)
VLDL: 46 mg/dL — ABNORMAL HIGH (ref 0–40)

## 2013-02-13 MED ORDER — NYSTATIN 100000 UNIT/GM EX POWD: CUTANEOUS | Status: DC

## 2013-02-13 NOTE — Patient Instructions (Signed)
Ketotifen eye solution What is this medicine? KETOTIFEN (kee toe TYE fen) eye drops are used in the eye to prevent itching that is caused by allergies. This medicine may be used for other purposes; ask your health care provider or pharmacist if you have questions. COMMON BRAND NAME(S): Alaway, Claritin Eye, Eye Itch Relief, Itchy Eye, Zaditor, Zyrtec Itchy Eye  What should I tell my health care provider before I take this medicine? They need to know if you have any of these conditions: -wear contact lenses -an unusual or allergic reaction to ketotifen, other medicines, foods, dyes, or preservatives -pregnant or trying to get pregnant -breast-feeding How should I use this medicine? This medicine is only for use in the eye. Do not take by mouth. Follow the directions on the prescription label. Wash hands before and after use. Tilt the head back slightly and pull down the lower eyelid with the index finger to form a pouch. Try not to touch the tip of the dropper to your eye, fingertips, or any other surface. Squeeze the prescribed number of drops into the pouch. Close the eye gently to spread the drops. Your vision may blur for a few minutes. Use your doses at regular intervals. Do not use your medicine more often than directed. If you use other eye medicines, they should be used at least 10 minutes before or after this medicine. Eye ointments should be applied last. Talk to your pediatrician regarding the use of this medicine in children. Special care may be needed. While this drug may be prescribed for children as young as 10 years of age for selected conditions, precautions do apply. Overdosage: If you think you have taken too much of this medicine contact a poison control center or emergency room at once. NOTE: This medicine is only for you. Do not share this medicine with others. What if I miss a dose? If you miss a dose, use it as soon as you can. If it is almost time for your next dose, use only  that dose. Do not use double or extra doses. What may interact with this medicine? Interactions are not expected. Do not use any other eye products without telling your doctor or health care professional. This list may not describe all possible interactions. Give your health care provider a list of all the medicines, herbs, non-prescription drugs, or dietary supplements you use. Also tell them if you smoke, drink alcohol, or use illegal drugs. Some items may interact with your medicine. What should I watch for while using this medicine? Tell your doctor or health care professional if your symptoms do not start to improve in 2 or 3 days. Report any serious side effects promptly. Stop using this medicine if your eyes get swollen, painful, or have a discharge, and see your doctor or health care professional as soon as you can. Contact lenses may be inserted 10 minutes after putting the medicine in the eye. Do not wear contact lenses if your eyes are red. You should not use this medicine to treat irritation that is caused by contact lenses. What side effects may I notice from receiving this medicine? Side effects that you should report to your doctor or health care professional as soon as possible: -skin rash, hives, or itching -swelling of the lips, tongue or face Side effects that usually do not require medical attention (report to your doctor or health care professional if they continue or are bothersome): -burning, discomfort, stinging, or tearing immediately after use -eyelid swelling  or eye dryness -headache -sensitivity of the eyes to sunlight This list may not describe all possible side effects. Call your doctor for medical advice about side effects. You may report side effects to FDA at 1-800-FDA-1088. Where should I keep my medicine? Keep out of the reach of children. Store between 4 and 25 degrees C (39 and 77 degrees F). Do not freeze. Protect from light. Throw away any unused medicine  after the expiration date. NOTE: This sheet is a summary. It may not cover all possible information. If you have questions about this medicine, talk to your doctor, pharmacist, or health care provider.  2014, [employer]vier/Gold Standard. (2007-08-10 14:36:34)

## 2013-02-13 NOTE — Progress Notes (Signed)
   Subjective:    Patient ID: Tracy Hicks, female    DOB: 1920-01-04, 79 y.o.   MRN: [id number]  HPI Comments: 79 yo female presents for 3 month F/U for HTN, Cholesterol, Pre-Dm, D. Deficient. She is not exercising. She is not eating very much at all, son has to force liquids on her. She notes she is ready to go to heaven. T 201 TG 257 H 45 L 105 A1C 5.7 D 88  CHRONIC EYE ITCH/ DRAINAGE. She constantly is rubbing her eyes, she occasionally has drainage. One RX drop helped one did not but not sure of which one works best.   He has noticed mild irritation around her back side but she does not seem to be bothered by it. Son is concerned for yeast infection.  Hypertension  Hyperlipidemia  Gastrophageal Reflux      Review of Systems  Constitutional: Positive for appetite change.  HENT: Positive for ear discharge and postnasal drip.   Skin: Positive for rash.  All other systems reviewed and are negative.       Objective:   Physical Exam  Nursing note and vitals reviewed. Constitutional: She is oriented to person, place, and time. She appears well-developed and well-nourished. No distress.  HENT:  Head: Normocephalic and atraumatic.  Right Ear: External ear normal.  Left Ear: External ear normal.  Nose: Nose normal.  Mouth/Throat: Oropharynx is clear and moist.  Eyes: Conjunctivae and EOM are normal. Right eye exhibits discharge. Left eye exhibits discharge.  clear  Neck: Normal range of motion. Neck supple. No JVD present. No thyromegaly present.  Cardiovascular: Normal rate, regular rhythm, normal heart sounds and intact distal pulses.   Pulmonary/Chest: Effort normal and breath sounds normal.  Abdominal: Soft. Bowel sounds are normal. She exhibits no distension and no mass. There is no tenderness. There is no rebound and no guarding.  Musculoskeletal: Normal range of motion. She exhibits no edema and no tenderness.  Lymphadenopathy:    She has no cervical adenopathy.   Neurological: She is alert and oriented to person, place, and time. No cranial nerve deficit.  Skin: Skin is warm and dry. No rash noted. No erythema. No pallor.  No visible rash but unable to view backside   Psychiatric: She has a normal mood and affect. Her behavior is normal. Judgment and thought content normal.          Assessment & Plan:  1.  3 month F/U for HTN, Cholesterol, Pre-Dm, D. Deficient. Needs healthy diet, cardio QD and obtain healthy weight. Check Labs, Check BP if >130/80 call office 2.Chronic eye itch with  ? Infection vs allergy- Advised OTC Allergy drop 3. ? Yeast infection with red buttock per son-  Nystatin 100000 gm apply BID AD, Restora 1 qd SX 1 box

## 2013-02-14 LAB — INSULIN, FASTING: Insulin fasting, serum: 62 u[IU]/mL — ABNORMAL HIGH (ref 3–28)

## 2013-04-01 ENCOUNTER — Encounter: Payer: Self-pay | Admitting: Emergency Medicine

## 2013-04-01 ENCOUNTER — Ambulatory Visit (INDEPENDENT_AMBULATORY_CARE_PROVIDER_SITE_OTHER): Payer: Medicare Other | Admitting: Emergency Medicine

## 2013-04-01 VITALS — BP 118/64 | HR 62 | Temp 98.0°F | Resp 16

## 2013-04-01 DIAGNOSIS — R5383 Other fatigue: Principal | ICD-10-CM

## 2013-04-01 DIAGNOSIS — R5381 Other malaise: Secondary | ICD-10-CM

## 2013-04-01 DIAGNOSIS — IMO0002 Reserved for concepts with insufficient information to code with codable children: Secondary | ICD-10-CM

## 2013-04-01 LAB — CBC WITH DIFFERENTIAL/PLATELET
Basophils Absolute: 0 10*3/uL (ref 0.0–0.1)
Basophils Relative: 0 % (ref 0–1)
EOS ABS: 0.2 10*3/uL (ref 0.0–0.7)
EOS PCT: 2 % (ref 0–5)
HCT: 41.8 % (ref 36.0–46.0)
HEMOGLOBIN: 14.8 g/dL (ref 12.0–15.0)
LYMPHS ABS: 2 10*3/uL (ref 0.7–4.0)
Lymphocytes Relative: 21 % (ref 12–46)
MCH: 31.2 pg (ref 26.0–34.0)
MCHC: 35.4 g/dL (ref 30.0–36.0)
MCV: 88.2 fL (ref 78.0–100.0)
Monocytes Absolute: 0.7 10*3/uL (ref 0.1–1.0)
Monocytes Relative: 7 % (ref 3–12)
Neutro Abs: 7 10*3/uL (ref 1.7–7.7)
Neutrophils Relative %: 70 % (ref 43–77)
Platelets: 262 10*3/uL (ref 150–400)
RBC: 4.74 MIL/uL (ref 3.87–5.11)
RDW: 14.2 % (ref 11.5–15.5)
WBC: 9.9 10*3/uL (ref 4.0–10.5)

## 2013-04-01 NOTE — Patient Instructions (Signed)
Toe Fracture A toe fracture is a break in the bone of a toe. It may take 6 to 8 weeks for the toe injury to heal. HOME CARE  "Buddy taping" is taping the broken toe to the toe next to it. Leave the toes taped together for at least 1 week or as told by your doctor. Change the tape after bathing. Always use a small piece of gauze or cotton between the toes when taping them together.  Put ice on the injured area.  Put ice in a plastic bag.  Place a towel between your skin and the bag.  Leave the ice on for 15-20 minutes, 03-04 times a day.  After the first 2 days, put heat on the injured area. Use heat for the next 2 to 3 days. Put a heating pad on the foot or soak the foot in warm water as told by your doctor.  Keep the foot raised (elevated) above the level of your heart.  Wear sturdy, supportive shoes. The shoes should not pinch the toes or fit tightly against the toes.  Use a cast shoe (if prescribed) if the foot is very puffy (swollen).  Use crutches if you have pain or it hurts too much to walk.  Only take medicine as told by your doctor.  Follow up with your doctor as told. GET HELP RIGHT AWAY IF:   There is pain or puffiness that is not helped by medicine.  The pain does not get better after 1 week.  The toe is cold when the others are warm.  The toe loses feeling (numb) or turns white.  The toe becomes hot and red (inflamed). MAKE SURE YOU:   Understand these instructions.  Will watch this condition.  Will get help right away if you are not doing well or get worse. Document Released: 07/27/2007 Document Revised: 05/02/2011 Document Reviewed: 07/03/2009 [facility] Patient Information 2014 [place], [place]. Laceration Care, Adult A laceration is a cut that goes through all layers of the skin. The cut goes into the tissue beneath the skin. HOME CARE For stitches (sutures) or staples:  Keep the cut clean and dry.  If you have a bandage (dressing), change it at  least once a day. Change the bandage if it gets wet or dirty, or as told by your doctor.  Wash the cut with soap and water 2 times a day. Rinse the cut with water. Pat it dry with a clean towel.  Put a thin layer of medicated cream on the cut as told by your doctor.  You may shower after the first 24 hours. Do not soak the cut in water until the stitches are removed.  Only take medicines as told by your doctor.  Have your stitches or staples removed as told by your doctor. For skin adhesive strips:  Keep the cut clean and dry.  Do not get the strips wet. You may take a bath, but be careful to keep the cut dry.  If the cut gets wet, pat it dry with a clean towel.  The strips will fall off on their own. Do not remove the strips that are still stuck to the cut. For wound glue:  You may shower or take baths. Do not soak or scrub the cut. Do not swim. Avoid heavy sweating until the glue falls off on its own. After a shower or bath, pat the cut dry with a clean towel.  Do not put medicine on your cut until the glue  falls off.  If you have a bandage, do not put tape over the glue.  Avoid lots of sunlight or tanning lamps until the glue falls off. Put sunscreen on the cut for the first year to reduce your scar.  The glue will fall off on its own. Do not pick at the glue. You may need a tetanus shot if:  You cannot remember when you had your last tetanus shot.  You have never had a tetanus shot. If you need a tetanus shot and you choose not to have one, you may get tetanus. Sickness from tetanus can be serious. GET HELP RIGHT AWAY IF:   Your pain does not get better with medicine.  Your arm, hand, leg, or foot loses feeling (numbness) or changes color.  Your cut is bleeding.  Your joint feels weak, or you cannot use your joint.  You have painful lumps on your body.  Your cut is red, puffy (swollen), or painful.  You have a red line on the skin near the cut.  You have  yellowish-white fluid (pus) coming from the cut.  You have a fever.  You have a bad smell coming from the cut or bandage.  Your cut breaks open before or after stitches are removed.  You notice something coming out of the cut, such as wood or glass.  You cannot move a finger or toe. MAKE SURE YOU:   Understand these instructions.  Will watch your condition.  Will get help right away if you are not doing well or get worse. Document Released: 07/27/2007 Document Revised: 05/02/2011 Document Reviewed: 08/03/2010 [facility] Patient Information 2014 [place].

## 2013-04-01 NOTE — Progress Notes (Signed)
Subjective:    Patient ID: Tracy Hicks, female    DOB: [birth year], 80 y.o.   MRN: [id number]  HPI Comments: 80 yo female with increased falls over last week or so. Son notes she is drinking plenty of fluids, she does not seem more confused/ tired than normal, nor does she seem to be off balance. He notes she is getting up more at night and walking around and that seems to be cause of more falls. He denies any urine changes.  He has cleaned all open wounds and tries to keep them covered but PT continues to remove bandages. He also thinks she may have broken right 3rd toe from one of her walking episodes.   Current Outpatient Prescriptions on File Prior to Visit  Medication Sig Dispense Refill  . cholecalciferol (VITAMIN D) 1000 UNITS tablet Take 2,000 Units by mouth daily.      . clopidogrel (PLAVIX) 75 MG tablet Take 75 mg by mouth daily with supper.      . diltiazem (CARDIZEM CD) 120 MG 24 hr capsule Take 1 capsule (120 mg total) by mouth daily.  30 capsule  0  . donepezil (ARICEPT) 10 MG tablet Take 10 mg by mouth at bedtime.      . fexofenadine (ALLEGRA) 180 MG tablet Take 180 mg by mouth daily.      . furosemide (LASIX) 40 MG tablet Take 40 mg by mouth 2 (two) times daily.      Marland Kitchen. levothyroxine (SYNTHROID, LEVOTHROID) 88 MCG tablet Take 88 mcg by mouth daily before breakfast.      . metoprolol tartrate (LOPRESSOR) 25 MG tablet Take 1 tablet (25 mg total) by mouth 2 (two) times daily. 12.5 in the morning and 25 mg at bedtime      . nitroGLYCERIN (NITROSTAT) 0.4 MG SL tablet Place 0.4 mg under the tongue every 5 (five) minutes as needed for chest pain.      . pantoprazole (PROTONIX) 40 MG tablet Take 40 mg by mouth daily.      Marland Kitchen. tolterodine (DETROL) 2 MG tablet Take 4 mg by mouth 2 (two) times daily.      . traMADol (ULTRAM) 50 MG tablet as needed.        No current facility-administered medications on file prior to visit.   Allergies  Allergen Reactions  . Bactrim  [Sulfamethoxazole-Trimethoprim]    Past Medical History  Diagnosis Date  . Hypertension   . Dementia   . Hyperlipidemia   . Coronary artery disease   . STEMI (ST elevation myocardial infarction)   . Hyperlipidemia   . Stroke   . GERD (gastroesophageal reflux disease)   . Hypothyroid   . ASHD (arteriosclerotic heart disease)       Review of Systems  Musculoskeletal: Positive for joint swelling.  Skin: Positive for wound.  Psychiatric/Behavioral: Positive for sleep disturbance.  All other systems reviewed and are negative.   BP 118/64  Pulse 62  Temp(Src) 98 F (36.7 C) (Temporal)  Resp 16     Objective:   Physical Exam  Nursing note and vitals reviewed. Constitutional: She is oriented to person, place, and time. She appears well-developed and well-nourished.  HENT:  Head: Normocephalic and atraumatic.  Right Ear: External ear normal.  Left Ear: External ear normal.  Nose: Nose normal.  Mouth/Throat: Oropharynx is clear and moist.  Eyes: Conjunctivae and EOM are normal.  Neck: Normal range of motion.  Cardiovascular: Normal rate, regular rhythm, normal heart sounds and intact distal  pulses.   Pulmonary/Chest: Effort normal and breath sounds normal.  Abdominal: Soft. Bowel sounds are normal. There is no tenderness.  Musculoskeletal: Normal range of motion.  Lymphadenopathy:    She has no cervical adenopathy.  Neurological: She is alert and oriented to person, place, and time.  Skin: Skin is warm and dry.  -Right 3rd toe erythema/ ecchymosis/ edema -2nd toe mild erythema/ edema -Right humerus with quarter size skin tear and 2 inches of surrounding ecchymosis -Right distal forearm nickel skin tear with quarter size ecchymosis   Psychiatric: She has a normal mood and affect.          Assessment & Plan:  1. Multiple wounds without infection from frequent falls- Bandages removed and areas cleaned with alcohol, Tegaderm applied to each open wound, hygiene  explained. Check labs, Advised we can get Scans/ xrays if desires but may not be beneficial, w/c if SX increase or desirs further workup  2. Right 3rd toe probable FX- Buddy taping advised but prefer to keep her in hard sole shoes, too much risk for boot/ orthotic shoe

## 2013-04-02 ENCOUNTER — Other Ambulatory Visit: Payer: Self-pay | Admitting: Internal Medicine

## 2013-04-02 LAB — BASIC METABOLIC PANEL WITHOUT GFR
BUN: 18 mg/dL (ref 6–23)
CO2: 35 meq/L — ABNORMAL HIGH (ref 19–32)
Calcium: 9.3 mg/dL (ref 8.4–10.5)
Chloride: 99 meq/L (ref 96–112)
Creat: 1.08 mg/dL (ref 0.50–1.10)
GFR, Est African American: 52 mL/min — ABNORMAL LOW
GFR, Est Non African American: 45 mL/min — ABNORMAL LOW
Glucose, Bld: 110 mg/dL — ABNORMAL HIGH (ref 70–99)
Potassium: 3.8 meq/L (ref 3.5–5.3)
Sodium: 142 meq/L (ref 135–145)

## 2013-04-02 LAB — HEPATIC FUNCTION PANEL
ALT: 11 U/L (ref 0–35)
AST: 17 U/L (ref 0–37)
Albumin: 4.2 g/dL (ref 3.5–5.2)
Alkaline Phosphatase: 75 U/L (ref 39–117)
Bilirubin, Direct: 0.1 mg/dL (ref 0.0–0.3)
Indirect Bilirubin: 0.6 mg/dL (ref 0.2–1.2)
Total Bilirubin: 0.7 mg/dL (ref 0.2–1.2)
Total Protein: 6.6 g/dL (ref 6.0–8.3)

## 2013-04-02 LAB — TSH: TSH: 2.446 u[IU]/mL (ref 0.350–4.500)

## 2013-04-11 ENCOUNTER — Other Ambulatory Visit: Payer: Self-pay | Admitting: Internal Medicine

## 2013-04-24 ENCOUNTER — Other Ambulatory Visit: Payer: Self-pay | Admitting: Internal Medicine

## 2013-05-09 ENCOUNTER — Ambulatory Visit: Payer: Self-pay | Admitting: Emergency Medicine

## 2013-07-11 ENCOUNTER — Other Ambulatory Visit: Payer: Self-pay | Admitting: Internal Medicine

## 2013-07-29 ENCOUNTER — Ambulatory Visit (INDEPENDENT_AMBULATORY_CARE_PROVIDER_SITE_OTHER): Payer: Medicare Other | Admitting: Emergency Medicine

## 2013-07-29 ENCOUNTER — Encounter: Payer: Self-pay | Admitting: Emergency Medicine

## 2013-07-29 VITALS — BP 102/58 | HR 62 | Temp 98.0°F | Resp 16

## 2013-07-29 DIAGNOSIS — R5381 Other malaise: Secondary | ICD-10-CM

## 2013-07-29 DIAGNOSIS — R5383 Other fatigue: Secondary | ICD-10-CM

## 2013-07-29 DIAGNOSIS — N390 Urinary tract infection, site not specified: Secondary | ICD-10-CM

## 2013-07-29 DIAGNOSIS — I10 Essential (primary) hypertension: Secondary | ICD-10-CM

## 2013-07-29 LAB — CBC WITH DIFFERENTIAL/PLATELET
BASOS ABS: 0 10*3/uL (ref 0.0–0.1)
Basophils Relative: 0 % (ref 0–1)
EOS ABS: 0.2 10*3/uL (ref 0.0–0.7)
EOS PCT: 2 % (ref 0–5)
HEMATOCRIT: 40.5 % (ref 36.0–46.0)
HEMOGLOBIN: 14.2 g/dL (ref 12.0–15.0)
Lymphocytes Relative: 18 % (ref 12–46)
Lymphs Abs: 2.2 10*3/uL (ref 0.7–4.0)
MCH: 30.4 pg (ref 26.0–34.0)
MCHC: 35.1 g/dL (ref 30.0–36.0)
MCV: 86.7 fL (ref 78.0–100.0)
MONO ABS: 0.8 10*3/uL (ref 0.1–1.0)
MONOS PCT: 7 % (ref 3–12)
Neutro Abs: 8.8 10*3/uL — ABNORMAL HIGH (ref 1.7–7.7)
Neutrophils Relative %: 73 % (ref 43–77)
Platelets: 247 10*3/uL (ref 150–400)
RBC: 4.67 MIL/uL (ref 3.87–5.11)
RDW: 15 % (ref 11.5–15.5)
WBC: 12 10*3/uL — ABNORMAL HIGH (ref 4.0–10.5)

## 2013-07-29 LAB — TSH: TSH: 2.149 u[IU]/mL (ref 0.350–4.500)

## 2013-07-29 LAB — HEPATIC FUNCTION PANEL
ALT: 11 U/L (ref 0–35)
AST: 15 U/L (ref 0–37)
Albumin: 4 g/dL (ref 3.5–5.2)
Alkaline Phosphatase: 81 U/L (ref 39–117)
BILIRUBIN INDIRECT: 0.4 mg/dL (ref 0.2–1.2)
Bilirubin, Direct: 0.1 mg/dL (ref 0.0–0.3)
Total Bilirubin: 0.5 mg/dL (ref 0.2–1.2)
Total Protein: 6.3 g/dL (ref 6.0–8.3)

## 2013-07-29 LAB — BASIC METABOLIC PANEL WITH GFR
BUN: 16 mg/dL (ref 6–23)
CHLORIDE: 102 meq/L (ref 96–112)
CO2: 31 mEq/L (ref 19–32)
Calcium: 9.6 mg/dL (ref 8.4–10.5)
Creat: 1.1 mg/dL (ref 0.50–1.10)
GFR, Est African American: 51 mL/min — ABNORMAL LOW
GFR, Est Non African American: 44 mL/min — ABNORMAL LOW
Glucose, Bld: 113 mg/dL — ABNORMAL HIGH (ref 70–99)
POTASSIUM: 5 meq/L (ref 3.5–5.3)
Sodium: 143 mEq/L (ref 135–145)

## 2013-07-29 NOTE — Progress Notes (Signed)
Subjective:    Patient ID: Tracy Hicks, female    DOB: November 14, 1919, 80 y.o.   MRN: [id number]  HPI Comments: 80 yo WF f/u for HTN. She has been having mild headaches. Son notes she is bruising more easily. She has not been walking as much. Son notes has needs assistance more with moving around. She has increased edema in the afternoon. He tries to keep her feet propped up which helps. He notes her urine has smelled more strong over last several days.  WBC             9.9   04/01/2013 HGB            14.8   04/01/2013 HCT            41.8   04/01/2013 PLT             262   04/01/2013 GLUCOSE         110   04/01/2013 CHOL            212   02/13/2013 TRIG            229   02/13/2013 HDL              47   02/13/2013 LDLCALC         119   02/13/2013 ALT              11   04/01/2013 AST              17   04/01/2013 NA              142   04/01/2013 K               3.8   04/01/2013 CL               99   04/01/2013 CREATININE     1.08   04/01/2013 BUN              18   04/01/2013 CO2              35   04/01/2013 TSH           2.446   04/01/2013 HGBA1C          5.7   02/13/2013   Hypertension Associated symptoms include headaches.  Gastrophageal Reflux Associated symptoms include fatigue.     Medication List       This list is accurate as of: 07/29/13 11:38 AM.  Always use your most recent med list.               cholecalciferol 1000 UNITS tablet  Commonly known as:  VITAMIN D  Take 2,000 Units by mouth daily.     clopidogrel 75 MG tablet  Commonly known as:  PLAVIX  Take 75 mg by mouth daily with supper.     diltiazem 120 MG 24 hr capsule  Commonly known as:  CARDIZEM CD  Take 1 capsule (120 mg total) by mouth daily.     donepezil 10 MG tablet  Commonly known as:  ARICEPT  Take 10 mg by mouth at bedtime.     fexofenadine 180 MG tablet  Commonly known as:  ALLEGRA  Take 180 mg by mouth daily.     furosemide 40 MG tablet  Commonly known as:  LASIX  Take 40 mg by mouth 2 (two) times daily.     isosorbide mononitrate 30 MG 24 hr tablet  Commonly known as:  IMDUR  TAKE 1 TABLET EVERY MORNINGAND 2 TABLETS AT BEDTIME     levothyroxine 88 MCG tablet  Commonly known as:  SYNTHROID, LEVOTHROID  TAKE 1 TABLET BY MOUTH EVERY DAY     metoprolol tartrate 25 MG tablet  Commonly known as:  LOPRESSOR  TAKE 1 TABLET TWICE A DAY  OR AS DIRECTED FOR BLOOD   PRESSURE     nitroGLYCERIN 0.4 MG SL tablet  Commonly known as:  NITROSTAT  Place 0.4 mg under the tongue every 5 (five) minutes as needed for chest pain.     pantoprazole 40 MG tablet  Commonly known as:  PROTONIX  Take 40 mg by mouth daily.     tolterodine 2 MG tablet  Commonly known as:  DETROL  TAKE 1 TABLET TWICE A DAY  FOR BLADDER CONTROL     traMADol 50 MG tablet  Commonly known as:  ULTRAM  as needed.       Allergies  Allergen Reactions  . Bactrim [Sulfamethoxazole-Trimethoprim]    Past Medical History  Diagnosis Date  . Hypertension   . Dementia   . Hyperlipidemia   . Coronary artery disease   . STEMI (ST elevation myocardial infarction)   . Hyperlipidemia   . Stroke   . GERD (gastroesophageal reflux disease)   . Hypothyroid   . ASHD (arteriosclerotic heart disease)         Review of Systems  Constitutional: Positive for fatigue.  Cardiovascular: Positive for leg swelling.  Genitourinary: Positive for difficulty urinating.  Neurological: Positive for headaches.  All other systems reviewed and are negative.  BP 102/58  Pulse 62  Temp(Src) 98 F (36.7 C) (Temporal)  Resp 16     Objective:   Physical Exam  Nursing note and vitals reviewed. Constitutional: She is oriented to person, place, and time. She appears well-developed and well-nourished. No distress.  HENT:  Head: Normocephalic and atraumatic.  Right Ear: External ear normal.  Left Ear: External ear normal.  Nose: Nose normal.  Mouth/Throat: Oropharynx is clear and moist.  Eyes: Conjunctivae and EOM are normal.  Neck: Normal range  of motion. Neck supple. No JVD present. No thyromegaly present.  Cardiovascular: Normal rate, regular rhythm, normal heart sounds and intact distal pulses.   1 + Bilateral LE edema  Pulmonary/Chest: Effort normal and breath sounds normal.  Abdominal: Soft. Bowel sounds are normal. She exhibits no distension and no mass. There is no tenderness. There is no rebound and no guarding.  Musculoskeletal: Normal range of motion. She exhibits no edema and no tenderness.  wheelchair  Lymphadenopathy:    She has no cervical adenopathy.  Neurological: She is alert and oriented to person, place, and time. No cranial nerve deficit.  Will speak occasionally, can sing "[place]"  Skin: Skin is warm and dry. No rash noted. No erythema. No pallor.  Ecchymosis scattered extremeties  Psychiatric: She has a normal mood and affect. Her behavior is normal.          Assessment & Plan:  1. HTN- Check BP call if >130/80, increase cardio   2. Fall risk/ bruising/ fatigue-Check labs, Decrease plavix to QOD. Son declines Hospice to help decrease risk  3. ? UTI- Check labs

## 2013-07-30 ENCOUNTER — Other Ambulatory Visit: Payer: Self-pay | Admitting: Emergency Medicine

## 2013-07-30 LAB — URINALYSIS, ROUTINE W REFLEX MICROSCOPIC
BILIRUBIN URINE: NEGATIVE
GLUCOSE, UA: NEGATIVE mg/dL
KETONES UR: NEGATIVE mg/dL
Nitrite: POSITIVE — AB
PH: 7 (ref 5.0–8.0)
PROTEIN: NEGATIVE mg/dL
Specific Gravity, Urine: 1.01 (ref 1.005–1.030)
Urobilinogen, UA: 0.2 mg/dL (ref 0.0–1.0)

## 2013-07-30 LAB — URINALYSIS, MICROSCOPIC ONLY
CASTS: NONE SEEN
CRYSTALS: NONE SEEN
WBC, UA: >50 WBC/hpf — AB (ref <3)

## 2013-07-30 MED ORDER — NITROFURANTOIN MONOHYD MACRO 100 MG PO CAPS: 100.0000 mg | ORAL_CAPSULE | Freq: Two times a day (BID) | ORAL | Status: DC

## 2013-07-31 ENCOUNTER — Other Ambulatory Visit: Payer: Self-pay | Admitting: Emergency Medicine

## 2013-07-31 LAB — URINE CULTURE: Colony Count: >=100000

## 2013-08-16 ENCOUNTER — Other Ambulatory Visit: Payer: Self-pay | Admitting: *Deleted

## 2013-08-16 MED ORDER — PANTOPRAZOLE SODIUM 40 MG PO TBEC: 40.0000 mg | DELAYED_RELEASE_TABLET | Freq: Every day | ORAL | Status: DC

## 2013-08-16 MED ORDER — CLOPIDOGREL BISULFATE 75 MG PO TABS: 75.0000 mg | ORAL_TABLET | Freq: Every day | ORAL | Status: DC

## 2013-08-16 MED ORDER — DONEPEZIL HCL 10 MG PO TABS: 10.0000 mg | ORAL_TABLET | Freq: Every day | ORAL | Status: DC

## 2013-08-19 ENCOUNTER — Other Ambulatory Visit: Payer: Self-pay | Admitting: Emergency Medicine

## 2013-08-19 NOTE — Progress Notes (Signed)
Called patient's son and he refuses to take patient to ER. Requesting to speak with Kallie EdwardMelissa Smith,PA-C or Dr.McKeown about patient's situation. I told patient's son it was important she be taken to the ER because she needed further testing that we are unable to do in the office. Patient's son said he's afraid if he takes her to the ER that she won't come home. Melissa Smith,PA-C and Dr.McKeown both informed.

## 2013-08-22 ENCOUNTER — Other Ambulatory Visit: Payer: Self-pay | Admitting: Physician Assistant

## 2013-09-03 ENCOUNTER — Ambulatory Visit: Payer: Self-pay

## 2013-09-10 ENCOUNTER — Ambulatory Visit: Payer: Self-pay

## 2013-10-20 ENCOUNTER — Other Ambulatory Visit: Payer: Self-pay | Admitting: Internal Medicine

## 2013-11-04 ENCOUNTER — Other Ambulatory Visit: Payer: Self-pay | Admitting: *Deleted

## 2013-11-04 MED ORDER — DILTIAZEM HCL ER COATED BEADS 120 MG PO CP24: 120.0000 mg | ORAL_CAPSULE | Freq: Every day | ORAL | Status: DC

## 2013-11-04 MED ORDER — PANTOPRAZOLE SODIUM 40 MG PO TBEC: 40.0000 mg | DELAYED_RELEASE_TABLET | Freq: Every day | ORAL | Status: DC

## 2013-11-04 MED ORDER — TOLTERODINE TARTRATE 2 MG PO TABS: ORAL_TABLET | ORAL | Status: DC

## 2013-11-04 MED ORDER — LEVOTHYROXINE SODIUM 88 MCG PO TABS: ORAL_TABLET | ORAL | Status: AC

## 2013-11-04 MED ORDER — FUROSEMIDE 40 MG PO TABS: ORAL_TABLET | ORAL | Status: DC

## 2013-11-04 MED ORDER — CLOPIDOGREL BISULFATE 75 MG PO TABS: 75.0000 mg | ORAL_TABLET | Freq: Every day | ORAL | Status: DC

## 2013-11-04 MED ORDER — ISOSORBIDE MONONITRATE ER 30 MG PO TB24: ORAL_TABLET | ORAL | Status: DC

## 2013-11-04 MED ORDER — DONEPEZIL HCL 10 MG PO TABS: 10.0000 mg | ORAL_TABLET | Freq: Every day | ORAL | Status: DC

## 2013-11-04 MED ORDER — METOPROLOL TARTRATE 25 MG PO TABS: ORAL_TABLET | ORAL | Status: DC

## 2013-11-04 MED ORDER — NITROGLYCERIN 0.4 MG SL SUBL: SUBLINGUAL_TABLET | SUBLINGUAL | Status: AC

## 2013-11-05 ENCOUNTER — Other Ambulatory Visit: Payer: Self-pay | Admitting: Internal Medicine

## 2013-11-18 ENCOUNTER — Encounter: Payer: Self-pay | Admitting: Emergency Medicine

## 2013-12-03 ENCOUNTER — Encounter: Payer: Self-pay | Admitting: Internal Medicine

## 2013-12-03 ENCOUNTER — Ambulatory Visit (INDEPENDENT_AMBULATORY_CARE_PROVIDER_SITE_OTHER): Payer: Medicare Other | Admitting: Internal Medicine

## 2013-12-03 VITALS — BP 118/74 | HR 64 | Temp 97.5°F | Resp 16

## 2013-12-03 DIAGNOSIS — Z0001 Encounter for general adult medical examination with abnormal findings: Secondary | ICD-10-CM

## 2013-12-03 DIAGNOSIS — I10 Essential (primary) hypertension: Secondary | ICD-10-CM

## 2013-12-03 DIAGNOSIS — R7309 Other abnormal glucose: Secondary | ICD-10-CM | POA: Insufficient documentation

## 2013-12-03 DIAGNOSIS — N183 Chronic kidney disease, stage 3 unspecified: Secondary | ICD-10-CM | POA: Insufficient documentation

## 2013-12-03 DIAGNOSIS — Z79899 Other long term (current) drug therapy: Secondary | ICD-10-CM | POA: Insufficient documentation

## 2013-12-03 DIAGNOSIS — Z1331 Encounter for screening for depression: Secondary | ICD-10-CM

## 2013-12-03 DIAGNOSIS — Z23 Encounter for immunization: Secondary | ICD-10-CM

## 2013-12-03 DIAGNOSIS — E559 Vitamin D deficiency, unspecified: Secondary | ICD-10-CM | POA: Insufficient documentation

## 2013-12-03 DIAGNOSIS — R296 Repeated falls: Secondary | ICD-10-CM

## 2013-12-03 DIAGNOSIS — Z1212 Encounter for screening for malignant neoplasm of rectum: Secondary | ICD-10-CM

## 2013-12-03 DIAGNOSIS — Z9181 History of falling: Secondary | ICD-10-CM

## 2013-12-03 DIAGNOSIS — R6889 Other general symptoms and signs: Secondary | ICD-10-CM

## 2013-12-03 DIAGNOSIS — E785 Hyperlipidemia, unspecified: Secondary | ICD-10-CM

## 2013-12-03 LAB — CBC WITH DIFFERENTIAL/PLATELET
Basophils Absolute: 0 10*3/uL (ref 0.0–0.1)
Basophils Relative: 0 % (ref 0–1)
Eosinophils Absolute: 0.1 10*3/uL (ref 0.0–0.7)
Eosinophils Relative: 1 % (ref 0–5)
HCT: 41.7 % (ref 36.0–46.0)
Hemoglobin: 14.4 g/dL (ref 12.0–15.0)
LYMPHS ABS: 1.6 10*3/uL (ref 0.7–4.0)
Lymphocytes Relative: 16 % (ref 12–46)
MCH: 30.1 pg (ref 26.0–34.0)
MCHC: 34.5 g/dL (ref 30.0–36.0)
MCV: 87.1 fL (ref 78.0–100.0)
Monocytes Absolute: 0.7 10*3/uL (ref 0.1–1.0)
Monocytes Relative: 7 % (ref 3–12)
NEUTROS PCT: 76 % (ref 43–77)
Neutro Abs: 7.4 10*3/uL (ref 1.7–7.7)
PLATELETS: 260 10*3/uL (ref 150–400)
RBC: 4.79 MIL/uL (ref 3.87–5.11)
RDW: 15 % (ref 11.5–15.5)
WBC: 9.8 10*3/uL (ref 4.0–10.5)

## 2013-12-03 NOTE — Progress Notes (Signed)
Patient ID: Tracy BlinksMary Hicks, female   DOB: Jul 26, 1919, 80 y.o.   MRN: [id number]  [facility],  Preventative Visit AND CPE  Assessment:   1. Encounter for general adult medical examination with abnormal findings   2. Essential hypertension  - Microalbumin / creatinine urine ratio - EKG 12-Lead - US, RETROPERITNL ABD,  LTD  3. CKD Stage III (GFR 44 ml/min)  - TSH  4. Hyperlipidemia  - Lipid panel  5. PreDiabetes  - Hemoglobin A1c - Insulin, fasting  6. Vitamin D deficiency  - Vit D  25 hydroxy (rtn osteoporosis monitoring)  7. Screening for rectal cancer  - POC Hemoccult Bld/Stl (3-Cd Home Screen); Future  8. Medication management  - Urine Microscopic - CBC with Differential - BASIC METABOLIC PANEL WITH GFR - Hepatic function panel - Magnesium  9. At high risk for falls   10. Screening for depression   11. Need for prophylactic vaccination and inoculation against influenza  - Flu vaccine HIGH DOSE PF (Fluzone Tri High dose)  Plan:   During the course of the visit the patient's son  was educated and counseled about appropriate screening and preventive services including:    Pneumococcal vaccine   Influenza vaccine  Td vaccine  Screening electrocardiogram  Bone densitometry screening  Colorectal cancer screening  Diabetes screening  Glaucoma screening  Nutrition counseling   Advanced directives: reommended   The patient's son felt that there would be no benefit for long term immunizations.  Screening recommendations, referrals: Vaccinations: DT vaccine declined Influenza vaccine 12/03/2013 Pneumococcal vaccine declined Prevnar vaccine requested Shingles vaccine declined Hep B vaccine not indicated  Nutrition assessed and recommended  Colonoscopy [facility] Recommended yearly ophthalmology/optometry visit for glaucoma screening and checkup Recommended yearly dental visit for hygiene and checkup Advanced directives -  None  Conditions/risks identified: BMI: Discussed weight loss, diet, and increase physical activity.  Increase physical activity: AHA recommends 150 minutes of physical activity a week.  Medications reviewed PreDiabetes is at goal, ACE/ARB therapy: no indicated. Urinary Incontinence is an issue: discussed non pharmacology and pharmacology options.  Fall risk: high- in wheel chair - son assists with transfers.discussed PT, home fall assessment, medications.   Subjective:   Tracy Hicks is a 80 y.o. female who presents for Medicare Annual Wellness Visit and complete physical.  Date of last medicare wellness visit is unknown. Ms Tracy Hicks has severe Dementia and requires 4+ total care in all ADL's which are done by her son who is in 24 hour attendance. [place].  She has had elevated blood pressure for years. Her blood pressure has been controlled at home, & today their BP is BP: 118/74 mmHg She does not workout. She denies chest pain, shortness of breath, dizziness.  She is not on cholesterol medication and denies myalgias. Her cholesterol is not at goal. The cholesterol last visit was:   Lab Results  Component Value Date   CHOL 212* 02/13/2013   HDL 47 02/13/2013   TRIG 229* 02/13/2013   CHOLHDL 4.5 02/13/2013   She has had prediabetes for years . She has not been working on diet and exercise for prediabetes, and denies symptomd. Last A1C in the office was:  Lab Results  Component Value Date   HGBA1C 5.7* 02/13/2013   Patient is on Vitamin D supplement.  Last Vit D was 88 in 2014.     Names of Other Physician/Practitioners you currently use: 1. [facility] here for primary care 2. No eye doctor, > 8-9 yrs. 3. No dentist, edentous  Patient Care Team: Lucky CowboyWilliam Jaley Yan, MD as PCP - General (Internal Medicine)  Medication Review: Medication Sig  . cholecalciferol (VITAMIN D) 1000 UNITS tablet  Take 2,000 Units by mouth daily.  . clopidogrel (PLAVIX) 75 MG tablet Take 1 tablet (75 mg total) by mouth daily with supper.  . donepezil (ARICEPT) 10 MG tablet Take 1 tablet (10 mg total) by mouth at bedtime.  . fexofenadine (ALLEGRA) 180 MG tablet Take 180 mg by mouth daily.  . furosemide (LASIX) 40 MG tablet TAKE 1 TABLET BY MOUTH TWICE A DAY  . isosorbide mononitrate (IMDUR) 30 MG 24 hr tablet TAKE 1 TABLET EVERY MORNINGAND 2 TABLETS AT BEDTIME  . levothyroxine (SYNTHROID, LEVOTHROID) 88 MCG tablet TAKE 1 TABLET BY MOUTH EVERY DAY  . metoprolol tartrate (LOPRESSOR) 25 MG tablet TAKE 1 TABLET TWICE A DAY OR AS DIRECTED FOR BLOOD PRESSURE  . nitroGLYCERIN (NITROSTAT) 0.4 MG SL tablet TAKE 1 TABLET SUBLINGUALLY EVERY 5 MINUTES FOR CHEST PAIN BUT NO MORE THAN 3 DOSES  . pantoprazole (PROTONIX) 40 MG tablet Take 1 tablet (40 mg total) by mouth daily.  Marland Kitchen. tolterodine (DETROL) 2 MG tablet TAKE 1 TABLET TWICE A DAY  FOR BLADDER CONTROL  . traMADol (ULTRAM) 50 MG tablet as needed.    Current Problems (verified) Patient Active Problem List   Diagnosis Date Noted  . CKD Stage III (GFR 44 ml/min) 12/03/2013  . PreDiabetes 12/03/2013  . Vitamin D deficiency 12/03/2013  . Medication management 12/03/2013  . Hypertension   . Dementia   . Hyperlipidemia   . Coronary artery disease   . GERD (gastroesophageal reflux disease)   . Hypothyroid   . ASHD (arteriosclerotic heart disease)   . Systolic CHF 09/14/2012   Screening Tests Health Maintenance  Topic Date Due  . Tetanus/tdap  10/18/1940  . Colonoscopy  10/19/1971  . Zostavax  10/18/1981  . Pneumococcal Polysaccharide Vaccine Age 78 And Over  10/19/1986  . Influenza Vaccine  09/21/2013   Immunization History  Administered Date(s) Administered  . Influenza Split 11/07/2012  . Influenza, High Dose Seasonal PF 12/03/2013   Preventative care: Last colonoscopy: [facility]  Prior vaccinations: TD or Tdap: > 8-9 yrs  Influenza:  12/03/2013  Pneumococcal: Declined Prevnar: Declined Shingles/Zostavax: declined  History reviewed: allergies, current medications, past family history, past medical history, past social history, past surgical history and problem list   Risk Factors: Tobacco History  Substance Use Topics  . Smoking status: Never Smoker   . Smokeless tobacco: Never Used  . Alcohol Use: No   She does not smoke.  Patient is not a former smoker. Are there smokers in your home (other than you)?  No  Alcohol Current alcohol use: none  Caffeine Current caffeine use: denies use  Exercise Current exercise: none  Nutrition/Diet Current diet: in general, a "healthy" diet    Cardiac risk factors: advanced age (older than 7855 for men, 5165 for women), dyslipidemia, hypertension and sedentary lifestyle.  Depression Screen (Note: if answer to either of the following is "Yes", a more complete depression screening is indicated) Basically patient does not comprehend the follow ing questions due to severe dementia.   Q1: Over the past two weeks, have you felt down, depressed or hopeless? UKN  Q2: Over the past two weeks, have you felt little interest or pleasure in doing things? UKN  Have you lost interest or pleasure in  daily life? UKN  Do you often feel hopeless? UKN  Do you cry easily over simple problems? UKN  Activities of Daily Living In your present state of health, do you have any difficulty performing the following activities?:  Driving? No Managing money?  No Feeding yourself? No Getting from bed to chair? No Climbing a flight of stairs? No Preparing food and eating?: No Bathing or showering? No Getting dressed: No Getting to the toilet? No Using the toilet:No Moving around from place to place: No In the past year have you fallen or had a near fall?:Yes, when slips out of her bed or wheelchair.   Are you sexually active?  No  Do you have more than one partner?  No  Vision  Difficulties: No  Hearing Difficulties: No Do you often ask people to speak up or repeat themselves? UKN Do you experience ringing or noises in your ears? UKN Do you have difficulty understanding soft or whispered voices? UKN  Cognition  Do you feel that you have a problem with memory?UKN  Do you often misplace items? No  Do you feel safe at home?  UKN  Advanced directives Does patient have a Health Care Power of Attorney? No Does patient have a Living Will? No   Objective:     Blood pressure 118/74, pulse 64, temperature 97.5 F (36.4 C), temperature source Temporal, resp. rate 16. There is no weight on file to calculate BMI.  General appearance: alert, no distress, WD/WN, female Cognitive Testing  Alert? Yes  Normal Appearance? No  Oriented to person?  No  Place? No   Time? No  Recall of three objects?  No  Can perform simple calculations? No  Displays appropriate judgment? No  Can read the correct time from a watch/clock? No  HEENT: normocephalic, sclerae anicteric, TMs pearly, nares patent, no discharge or erythema, pharynx normal Oral cavity: MMM, no lesions. (+) snout & palmo-mental responses. Neck: supple, no lymphadenopathy, no thyromegaly, no masses Heart: RRR, normal S1, S2, no murmurs Lungs: CTA bilaterally, no wheezes, rhonchi, or rales Abdomen: +bs, soft, non tender, non distended, no masses, no hepatomegaly, no splenomegaly Musculoskeletal: Generalized decrease in muscle power,  tone and bulk. no obvious deformity Extremities: no edema, no cyanosis, no clubbing Pulses: Not palpable, upper and lower extremities, normal cap refill Neurological:Dulled cognition alert, will track with eyes, CN2-12 intact, DTRs flat throughout, no cerebellar signs, gait not testable. Psychiatric: Appears comfortable and in no distress.  Medicare Attestation I have personally reviewed: The patient's medical and social history Their use of alcohol, tobacco or illicit  drugs Their current medications and supplements The patient's functional ability including ADLs,fall risks, home safety risks, cognitive, and hearing and visual impairment Diet and physical activities Evidence for depression or mood disorders  The patient's weight, height, BMI, and visual acuity have been recorded in the chart.  I have made referrals, counseling, and provided education to the patient based on review of the above and I have provided the patient with a written personalized care plan for preventive services.    Bridgit Eynon DAVID, MD   12/03/2013

## 2013-12-04 LAB — BASIC METABOLIC PANEL WITH GFR
BUN: 15 mg/dL (ref 6–23)
CHLORIDE: 101 meq/L (ref 96–112)
CO2: 30 meq/L (ref 19–32)
Calcium: 9.6 mg/dL (ref 8.4–10.5)
Creat: 1.16 mg/dL — ABNORMAL HIGH (ref 0.50–1.10)
GFR, Est African American: 47 mL/min — ABNORMAL LOW
GFR, Est Non African American: 41 mL/min — ABNORMAL LOW
Glucose, Bld: 130 mg/dL — ABNORMAL HIGH (ref 70–99)
Potassium: 4.2 mEq/L (ref 3.5–5.3)
SODIUM: 143 meq/L (ref 135–145)

## 2013-12-04 LAB — HEMOGLOBIN A1C
Hgb A1c MFr Bld: 5.9 % — ABNORMAL HIGH (ref <5.7)
Mean Plasma Glucose: 123 mg/dL — ABNORMAL HIGH (ref <117)

## 2013-12-04 LAB — HEPATIC FUNCTION PANEL
ALK PHOS: 80 U/L (ref 39–117)
ALT: 11 U/L (ref 0–35)
AST: 17 U/L (ref 0–37)
Albumin: 3.8 g/dL (ref 3.5–5.2)
BILIRUBIN DIRECT: 0.1 mg/dL (ref 0.0–0.3)
BILIRUBIN TOTAL: 0.6 mg/dL (ref 0.2–1.2)
Indirect Bilirubin: 0.5 mg/dL (ref 0.2–1.2)
Total Protein: 6.4 g/dL (ref 6.0–8.3)

## 2013-12-04 LAB — TSH: TSH: 1.697 u[IU]/mL (ref 0.350–4.500)

## 2013-12-04 LAB — LIPID PANEL
Cholesterol: 188 mg/dL (ref 0–200)
HDL: 45 mg/dL (ref >39)
LDL CALC: 106 mg/dL — AB (ref 0–99)
Total CHOL/HDL Ratio: 4.2 Ratio
Triglycerides: 186 mg/dL — ABNORMAL HIGH (ref <150)
VLDL: 37 mg/dL (ref 0–40)

## 2013-12-04 LAB — VITAMIN D 25 HYDROXY (VIT D DEFICIENCY, FRACTURES): Vit D, 25-Hydroxy: 77 ng/mL (ref 30–89)

## 2013-12-04 LAB — INSULIN, FASTING: Insulin fasting, serum: 69.9 u[IU]/mL — ABNORMAL HIGH (ref 2.0–19.6)

## 2013-12-04 LAB — MAGNESIUM: MAGNESIUM: 2.1 mg/dL (ref 1.5–2.5)

## 2014-01-01 ENCOUNTER — Encounter: Payer: Self-pay | Admitting: Internal Medicine

## 2014-01-29 ENCOUNTER — Other Ambulatory Visit: Payer: Self-pay

## 2014-01-29 MED ORDER — DONEPEZIL HCL 10 MG PO TABS: 10.0000 mg | ORAL_TABLET | Freq: Every day | ORAL | Status: DC

## 2014-02-06 ENCOUNTER — Other Ambulatory Visit: Payer: Self-pay | Admitting: *Deleted

## 2014-02-06 MED ORDER — PANTOPRAZOLE SODIUM 40 MG PO TBEC: 40.0000 mg | DELAYED_RELEASE_TABLET | Freq: Every day | ORAL | Status: DC

## 2014-02-07 ENCOUNTER — Other Ambulatory Visit: Payer: Self-pay | Admitting: *Deleted

## 2014-02-07 MED ORDER — TOLTERODINE TARTRATE 2 MG PO TABS: ORAL_TABLET | ORAL | Status: DC

## 2014-02-19 ENCOUNTER — Other Ambulatory Visit: Payer: Self-pay | Admitting: Internal Medicine

## 2014-03-02 ENCOUNTER — Other Ambulatory Visit: Payer: Self-pay | Admitting: Internal Medicine

## 2014-03-17 ENCOUNTER — Ambulatory Visit (INDEPENDENT_AMBULATORY_CARE_PROVIDER_SITE_OTHER): Payer: Medicare Other | Admitting: Physician Assistant

## 2014-03-17 VITALS — BP 98/50 | HR 58 | Temp 97.9°F | Resp 16

## 2014-03-17 DIAGNOSIS — E039 Hypothyroidism, unspecified: Secondary | ICD-10-CM

## 2014-03-17 DIAGNOSIS — N183 Chronic kidney disease, stage 3 unspecified: Secondary | ICD-10-CM

## 2014-03-17 DIAGNOSIS — Z79899 Other long term (current) drug therapy: Secondary | ICD-10-CM

## 2014-03-17 DIAGNOSIS — I2510 Atherosclerotic heart disease of native coronary artery without angina pectoris: Secondary | ICD-10-CM

## 2014-03-17 DIAGNOSIS — I5022 Chronic systolic (congestive) heart failure: Secondary | ICD-10-CM

## 2014-03-17 DIAGNOSIS — E559 Vitamin D deficiency, unspecified: Secondary | ICD-10-CM

## 2014-03-17 DIAGNOSIS — I10 Essential (primary) hypertension: Secondary | ICD-10-CM

## 2014-03-17 DIAGNOSIS — E785 Hyperlipidemia, unspecified: Secondary | ICD-10-CM

## 2014-03-17 DIAGNOSIS — R7309 Other abnormal glucose: Secondary | ICD-10-CM

## 2014-03-17 DIAGNOSIS — Z9181 History of falling: Secondary | ICD-10-CM

## 2014-03-17 LAB — HEPATIC FUNCTION PANEL
ALK PHOS: 79 U/L (ref 39–117)
ALT: 14 U/L (ref 0–35)
AST: 19 U/L (ref 0–37)
Albumin: 4 g/dL (ref 3.5–5.2)
BILIRUBIN INDIRECT: 0.4 mg/dL (ref 0.2–1.2)
BILIRUBIN TOTAL: 0.5 mg/dL (ref 0.2–1.2)
Bilirubin, Direct: 0.1 mg/dL (ref 0.0–0.3)
TOTAL PROTEIN: 6.5 g/dL (ref 6.0–8.3)

## 2014-03-17 LAB — BASIC METABOLIC PANEL WITH GFR
BUN: 17 mg/dL (ref 6–23)
CO2: 30 meq/L (ref 19–32)
Calcium: 9.4 mg/dL (ref 8.4–10.5)
Chloride: 101 mEq/L (ref 96–112)
Creat: 1.08 mg/dL (ref 0.50–1.10)
GFR, EST NON AFRICAN AMERICAN: 45 mL/min — AB
GFR, Est African American: 51 mL/min — ABNORMAL LOW
GLUCOSE: 110 mg/dL — AB (ref 70–99)
POTASSIUM: 4.2 meq/L (ref 3.5–5.3)
SODIUM: 142 meq/L (ref 135–145)

## 2014-03-17 LAB — CBC WITH DIFFERENTIAL/PLATELET
BASOS PCT: 1 % (ref 0–1)
Basophils Absolute: 0.1 10*3/uL (ref 0.0–0.1)
Eosinophils Absolute: 0.2 10*3/uL (ref 0.0–0.7)
Eosinophils Relative: 2 % (ref 0–5)
HCT: 40.7 % (ref 36.0–46.0)
HEMOGLOBIN: 14.5 g/dL (ref 12.0–15.0)
LYMPHS ABS: 1.9 10*3/uL (ref 0.7–4.0)
Lymphocytes Relative: 19 % (ref 12–46)
MCH: 31.3 pg (ref 26.0–34.0)
MCHC: 35.6 g/dL (ref 30.0–36.0)
MCV: 87.7 fL (ref 78.0–100.0)
MPV: 10.3 fL (ref 8.6–12.4)
Monocytes Absolute: 0.6 10*3/uL (ref 0.1–1.0)
Monocytes Relative: 6 % (ref 3–12)
NEUTROS ABS: 7.1 10*3/uL (ref 1.7–7.7)
Neutrophils Relative %: 72 % (ref 43–77)
Platelets: 227 10*3/uL (ref 150–400)
RBC: 4.64 MIL/uL (ref 3.87–5.11)
RDW: 13.8 % (ref 11.5–15.5)
WBC: 9.8 10*3/uL (ref 4.0–10.5)

## 2014-03-17 LAB — TSH: TSH: 3.368 u[IU]/mL (ref 0.350–4.500)

## 2014-03-17 LAB — MAGNESIUM: Magnesium: 2.1 mg/dL (ref 1.5–2.5)

## 2014-03-17 MED ORDER — NYSTATIN 100000 UNIT/GM EX CREA: 1.0000 "application " | TOPICAL_CREAM | Freq: Two times a day (BID) | CUTANEOUS | Status: AC

## 2014-03-17 MED ORDER — NYSTATIN 100000 UNIT/GM EX POWD: CUTANEOUS | Status: AC

## 2014-03-17 NOTE — Progress Notes (Signed)
Assessment and Plan:  Hypertension: hypotension at this time- will cut back on metoprolol, son is very concerned about changed medications too much due to intolerance to changes, may need to cut back on lasix but son is very hesistant. Continue medication, monitor blood pressure at home. Continue DASH diet.  Reminder to go to the ER if any CP, SOB, nausea, dizziness, severe HA, changes vision/speech, left arm numbness and tingling, and jaw pain. Cholesterol: Continue diet and exercise. Will not check cholesterol at this time.   Pre-diabetes-Continue diet and exercise. Will not check A1C at this time Vitamin D Def- check level and continue medications.  Hypothyroidism-check TSH level, continue medications the same, reminded to take on an empty stomach 30-82mins before food.  Diaper rash- if worse will come back- will do nystatin power and cream Dementia/high fall risk- continue help with ADLs, declines PT, declines hospice referral Left shoulder pain- ? Separation, declines Xray/PT  Continue diet and meds as discussed. Further disposition pending results of labs.  HPI 81 y.o. female  presents for 3 month follow up with hypertension, hyperlipidemia, prediabetes and vitamin D. Her blood pressure unknown if it has been controlled at home, today their BP is BP: (!) 98/50 mmHg.  Lab Results  Component Value Date   CREATININE 1.16* 12/03/2013   BUN 15 12/03/2013   NA 143 12/03/2013   K 4.2 12/03/2013   CL 101 12/03/2013   CO2 30 12/03/2013   She does not workout. She denies chest pain, shortness of breath, dizziness.  She has a significant CAD history, being medically managed due to age, she was tried off the plavix however she had severe headaches and was put back on it.   She has dementia, son Leonette Most) is here with her and helps her with her ADLs, she has incontinence and has had a diaper rash for 2 months, has used diaper paste. She is able to feed her self, is wheel chair bound, trouble  dressing her self/bathing herself.  She is not on cholesterol medication and denies myalgias. Her cholesterol is not at goal. The cholesterol last visit was:   Lab Results  Component Value Date   CHOL 188 12/03/2013   HDL 45 12/03/2013   LDLCALC 106* 12/03/2013   TRIG 186* 12/03/2013   CHOLHDL 4.2 12/03/2013   She has not been working on diet and exercise for prediabetes, and denies paresthesia of the feet, polydipsia, polyuria and visual disturbances. Last A1C in the office was:  Lab Results  Component Value Date   HGBA1C 5.9* 12/03/2013  Patient is on Vitamin D supplement.   Lab Results  Component Value Date   VD25OH 77 12/03/2013  She is on thyroid medication. Her medication was not changed last visit.   Lab Results  Component Value Date   TSH 1.697 12/03/2013  .Marland Kitchen   Updated Medication List Outpatient Encounter Prescriptions as of 03/17/2014  Medication Sig  . cholecalciferol (VITAMIN D) 1000 UNITS tablet Take 2,000 Units by mouth daily.  . clopidogrel (PLAVIX) 75 MG tablet Take 1 tablet (75 mg total) by mouth daily with supper.  . donepezil (ARICEPT) 10 MG tablet Take 1 tablet (10 mg total) by mouth at bedtime.  . fexofenadine (ALLEGRA) 180 MG tablet Take 180 mg by mouth daily.  . furosemide (LASIX) 40 MG tablet TAKE 1 TABLET BY MOUTH TWICE A DAY  . isosorbide mononitrate (IMDUR) 30 MG 24 hr tablet TAKE 1 TABLET EVERY MORNINGAND 2 TABLETS AT BEDTIME  . levothyroxine (SYNTHROID, LEVOTHROID)  88 MCG tablet TAKE 1 TABLET BY MOUTH EVERY DAY  . metoprolol tartrate (LOPRESSOR) 25 MG tablet TAKE 1 TABLET TWICE A DAY OR AS DIRECTED FOR BLOOD PRESSURE  . nitroGLYCERIN (NITROSTAT) 0.4 MG SL tablet TAKE 1 TABLET SUBLINGUALLY EVERY 5 MINUTES FOR CHEST PAIN BUT NO MORE THAN 3 DOSES  . pantoprazole (PROTONIX) 40 MG tablet Take 1 tablet (40 mg total) by mouth daily.  . pantoprazole (PROTONIX) 40 MG tablet TAKE 1 TABLET (40 MG TOTAL) BY MOUTH DAILY.  Marland Kitchen. tolterodine (DETROL) 2 MG tablet TAKE 1  TABLET TWICE A DAY  FOR BLADDER CONTROL  . traMADol (ULTRAM) 50 MG tablet as needed.      Current Medications:  Current Outpatient Prescriptions on File Prior to Visit  Medication Sig Dispense Refill  . cholecalciferol (VITAMIN D) 1000 UNITS tablet Take 2,000 Units by mouth daily.    . clopidogrel (PLAVIX) 75 MG tablet Take 1 tablet (75 mg total) by mouth daily with supper. 90 tablet 0  . donepezil (ARICEPT) 10 MG tablet Take 1 tablet (10 mg total) by mouth at bedtime. 90 tablet 1  . fexofenadine (ALLEGRA) 180 MG tablet Take 180 mg by mouth daily.    . furosemide (LASIX) 40 MG tablet TAKE 1 TABLET BY MOUTH TWICE A DAY 180 tablet 0  . isosorbide mononitrate (IMDUR) 30 MG 24 hr tablet TAKE 1 TABLET EVERY MORNINGAND 2 TABLETS AT BEDTIME 270 tablet 0  . levothyroxine (SYNTHROID, LEVOTHROID) 88 MCG tablet TAKE 1 TABLET BY MOUTH EVERY DAY 90 tablet 0  . metoprolol tartrate (LOPRESSOR) 25 MG tablet TAKE 1 TABLET TWICE A DAY OR AS DIRECTED FOR BLOOD PRESSURE 180 tablet 0  . nitroGLYCERIN (NITROSTAT) 0.4 MG SL tablet TAKE 1 TABLET SUBLINGUALLY EVERY 5 MINUTES FOR CHEST PAIN BUT NO MORE THAN 3 DOSES 25 tablet 0  . pantoprazole (PROTONIX) 40 MG tablet Take 1 tablet (40 mg total) by mouth daily. 90 tablet 1  . pantoprazole (PROTONIX) 40 MG tablet TAKE 1 TABLET (40 MG TOTAL) BY MOUTH DAILY. 90 tablet 1  . tolterodine (DETROL) 2 MG tablet TAKE 1 TABLET TWICE A DAY  FOR BLADDER CONTROL 180 tablet 1  . traMADol (ULTRAM) 50 MG tablet as needed.      No current facility-administered medications on file prior to visit.   Medical History:  Past Medical History  Diagnosis Date  . Hypertension   . Dementia   . Hyperlipidemia   . Coronary artery disease   . STEMI (ST elevation myocardial infarction)   . Hyperlipidemia   . Stroke   . GERD (gastroesophageal reflux disease)   . Hypothyroid   . ASHD (arteriosclerotic heart disease)    Allergies:  Allergies  Allergen Reactions  . Bactrim  [Sulfamethoxazole-Trimethoprim]      Review of Systems:  Review of Systems  Constitutional: Negative.   HENT: Negative.   Eyes: Negative.   Cardiovascular: Negative.   Gastrointestinal: Negative.  Negative for abdominal pain, diarrhea and constipation.  Genitourinary: Positive for frequency. Negative for dysuria, urgency, hematuria and flank pain.       Incontinence  Musculoskeletal: Positive for joint pain (right shoulder pain, hold close to her body) and falls (high fall risk). Negative for myalgias, back pain and neck pain.  Skin: Positive for rash. Negative for itching.  Neurological: Positive for dizziness. Negative for tingling, tremors, sensory change, speech change, focal weakness, seizures and loss of consciousness.  Psychiatric/Behavioral: Positive for memory loss. Negative for depression, suicidal ideas, hallucinations and substance abuse.  The patient is nervous/anxious. The patient does not have insomnia.    Family history- Review and unchanged Social history- Review and unchanged Physical Exam: BP 98/50 mmHg  Pulse 58  Temp(Src) 97.9 F (36.6 C) (Temporal)  Resp 16  Wt   SpO2 98% Wt Readings from Last 3 Encounters:  09/22/12 133 lb 3.2 oz (60.419 kg)   General Appearance: Well nourished, in no apparent distress. Eyes: PERRLA, EOMs, conjunctiva no swelling or erythema Sinuses: No Frontal/maxillary tenderness ENT/Mouth: Ext aud canals clear, TMs without erythema, bulging. No erythema, swelling, or exudate on post pharynx.  Tonsils not swollen or erythematous. Hearing normal.  Neck: Supple, thyroid normal.  Respiratory: Respiratory effort normal, BS equal bilaterally without rales, rhonchi, wheezing or stridor.  Cardio: RRR with no MRGs. Brisk peripheral pulses without edema.  Abdomen: Soft, + BS.  Non tender, no guarding, rebound, hernias, masses. Lymphatics: Non tender without lymphadenopathy.  Musculoskeletal: decreased ROM right arm,  4/5 strength, wheelchair  bound Skin: Warm, dry without rashes, lesions, ecchymosis. Son declines looking at the rash.  Neuro: Cranial nerves intact. Normal muscle tone, no cerebellar symptoms. Psych: Awake and oriented X 0, patient with dementia, majority from son   Quentin Mulling, PA-C 11:34 AM [facility]

## 2014-03-17 NOTE — Patient Instructions (Signed)

## 2014-04-03 ENCOUNTER — Other Ambulatory Visit: Payer: Self-pay | Admitting: Internal Medicine

## 2014-04-08 ENCOUNTER — Other Ambulatory Visit: Payer: Self-pay | Admitting: Internal Medicine

## 2014-05-06 ENCOUNTER — Ambulatory Visit (INDEPENDENT_AMBULATORY_CARE_PROVIDER_SITE_OTHER): Payer: Medicare Other | Admitting: Internal Medicine

## 2014-05-06 ENCOUNTER — Encounter: Payer: Self-pay | Admitting: Internal Medicine

## 2014-05-06 VITALS — BP 100/58 | HR 56 | Temp 98.0°F | Resp 16

## 2014-05-06 DIAGNOSIS — Z23 Encounter for immunization: Secondary | ICD-10-CM

## 2014-05-06 DIAGNOSIS — I2510 Atherosclerotic heart disease of native coronary artery without angina pectoris: Secondary | ICD-10-CM

## 2014-05-06 DIAGNOSIS — F0391 Unspecified dementia with behavioral disturbance: Secondary | ICD-10-CM

## 2014-05-06 DIAGNOSIS — T148XXA Other injury of unspecified body region, initial encounter: Secondary | ICD-10-CM

## 2014-05-06 MED ORDER — ALPRAZOLAM 0.5 MG PO TABS: 0.5000 mg | ORAL_TABLET | Freq: Every evening | ORAL | Status: DC | PRN

## 2014-05-06 NOTE — Progress Notes (Signed)
   Subjective:    Patient ID: Tracy Hicks, female    DOB: [birth year], 81 y.o.   MRN: [id number]  Wound Check  Patient is a 81 y.o. Female who presents to the office with her son who is her caretaker.  She has been sundowning per her sons report and she was very agitated.  She was wandering around and her son thinks that she hit her leg on the edge of her box spring.  He is sure that she did not fall.  He states that she has been bleeding severely.  She is currently on plavix.  They tried to discontinue plavix but she couldn't tolerate being off the medicine.  He reports that he cleaned her cut and dressed it but he felt that the cut needed to be evaluated.    He reports that she did not hit her head or fall that he is aware of.  She has been behaving at her baseline.     Review of Systems Level 5 caveat secondary to dementia See HPI above for history from son    Objective:   Physical Exam  Cardiovascular: Normal rate, regular rhythm, normal heart sounds and intact distal pulses.  Exam reveals no gallop and no friction rub.   No murmur heard. Pulses:      Dorsalis pedis pulses are 1+ on the right side.       Posterior tibial pulses are 1+ on the right side.  2+ pitting edema of the right leg.  Musculoskeletal: Normal range of motion.  Normal passive ROM of the right knee and ankle without obvious discomfort  Neurological: She is alert.  Skin: Skin is warm and dry.  6 in x 4 in skin avulsion with minimal active bleeding.  No purulent drainage, warmth, or surrounding cellulitis.  Tender to palpation.            Assessment & Plan:    1. Skin avulsion -skin trimmed here, wound was redressed -referral to the wound care center was addressed, but the patient's care giver declined -patient's caregiver given instructions to call and or go to the ER if the patient developed surrounding redness, warmth, purulent drainage, fever, nausea, vomiting, or increased confusion.   -Declined abx  at this time. -will recheck wound in 2 weeks or sooner if problems  2. Dementia, with behavioral disturbance -patient having sundown syndrome -low dose xanax as needed for agitation -fall precautions on medication discussed with caregiver and possible increased confusion on medication  3. Need for prophylactic vaccination with tetanus-diphtheria (TD) -patient given tetanus given skin avulsion - DT Vaccine greater than 7yo IM

## 2014-05-06 NOTE — Patient Instructions (Signed)
Deep Skin Avulsion °A deep skin avulsion is when all layers of the skin or parts of body structures have been torn away. This is usually a result of severe injury (trauma). A deep skin avulsion can include damage to important structures beneath the skin such as tendons, ligaments, nerves, or blood vessels.  °CAUSES  °Many injuries can lead to a deep skin avulsion. These include:  °· Crush injuries. °· Bites. °· Falls against jagged surfaces. °· Gunshot wounds. °· Severe burns and injuries involving dragging (such as those from a bicycle or motorcycle accident). °TREATMENT  °· If the wound is small and there is no damage to vital structures like nerves and blood vessels, the damaged tissues may be removed. Then, the wound can be cleaned thoroughly and closed. °· A skin graft may be performed. This is a procedure in which the outer layer of skin is removed from a different part of your body. That skin (skin graft) is used to cover the open wound. This can happen after damaged tissue is removed and repairs are completed. °· Your caregiver may only apply a bandage (dressing) to the wound. The wound will be kept clean and allowed to heal. Healing can take weeks or months and usually leaves a large scar. This type of treatment is only done if your caregiver feels that skin grafting or a similar procedure would not work. °You might need a tetanus shot if: °· You cannot remember when you had your last tetanus shot. °· You have never had a tetanus shot. °· The injury broke your skin. °If you got a tetanus shot, your arm may swell, get red, and feel warm to the touch. This is common and not a problem. If you need a tetanus shot and you choose not to have one, there is a rare chance of getting tetanus. Sickness from tetanus can be serious. °HOME CARE INSTRUCTIONS  °· Only take over-the-counter or prescription medicines for pain, discomfort, or fever as directed by your caregiver. °· Gently wash the area with mild soap and  water 2 times a day, or as directed. Rinse off the soap. Pat the area dry with a clean towel. Do not rub the wound. This may cause bleeding. °· Follow your caregiver's instructions for how often you need to change the dressing. °· Apply ointment and a dressing to the wound as directed. °· If the dressing sticks, moisten it with soapy water and gently remove it. °· Change the bandage right away if it becomes wet, dirty, or starts to smell bad. °· Take showers. Do not take tub baths, swim, or do anything that may soak the wound until it is healed. °· Use anti-itch medicine as directed by your caregiver. The wound may itch when it is healing. Do not pick or scratch at the wound. °· Follow up with your caregiver for stitches (sutures), staple, or skin adhesive strip removal. °SEEK MEDICAL CARE IF:  °· You have redness, swelling, or increasing pain in your wound. °· A red streak or line extends away from the wound. °· You have pus coming from the wound. °· You notice a bad smell coming from the wound or dressing. °· The wound breaks open (edges not staying together) after sutures have been removed. °· You notice something coming out of the wound, such as a small piece of wood, glass, or metal. °· You are unable to properly move a finger or toe if the wound is on your hand or foot. °· You have severe   swelling around the wound that causes pain and numbness. °· Your arm, hand, leg, or foot changes color. °SEEK IMMEDIATE MEDICAL CARE IF:  °· Your pain becomes severe or is not adequately relieved with pain medicine. °· You have a fever. °· You have nausea and vomiting for more than 24 hours. °· You feel lightheaded, weak, or faint. °· You develop chest pain or difficulty breathing. °MAKE SURE YOU:  °· Understand these instructions. °· Will watch your condition. °· Will get help right away if you are not doing well or get worse. °Document Released: 04/05/2006 Document Revised: 05/02/2011 Document Reviewed:  06/13/2010 °ExitCare® Patient Information ©2015 [employer]. This information is not intended to replace advice given to you by your health care provider. Make sure you discuss any questions you have with your health care provider. ° °

## 2014-05-20 ENCOUNTER — Ambulatory Visit (INDEPENDENT_AMBULATORY_CARE_PROVIDER_SITE_OTHER): Payer: Medicare Other | Admitting: Internal Medicine

## 2014-05-20 ENCOUNTER — Encounter: Payer: Self-pay | Admitting: Internal Medicine

## 2014-05-20 VITALS — BP 138/72 | HR 57 | Temp 96.4°F | Resp 18

## 2014-05-20 DIAGNOSIS — T148 Other injury of unspecified body region: Secondary | ICD-10-CM

## 2014-05-20 DIAGNOSIS — T148XXA Other injury of unspecified body region, initial encounter: Secondary | ICD-10-CM

## 2014-05-20 DIAGNOSIS — R609 Edema, unspecified: Secondary | ICD-10-CM

## 2014-05-20 DIAGNOSIS — F0390 Unspecified dementia without behavioral disturbance: Secondary | ICD-10-CM

## 2014-05-20 NOTE — Patient Instructions (Signed)
Hospice °Hospice is a service that is designed to provide people who are terminally ill and their families with medical, spiritual, and psychological support. Its aim is to improve your quality of life by keeping you as alert and comfortable as possible. Hospice is performed by a team of health care professionals and volunteers who: °· Help keep you comfortable. Hospice can be provided in your home or in a homelike setting. The hospice staff works with your family and friends to help meet your needs. You will enjoy the support of loved ones by receiving much of your basic care from family and friends. °· Provide pain relief and manage your symptoms. The staff supply all necessary medicines and equipment. °· Provide companionship when you are alone. °· Allow you and your family to rest. They may do light housekeeping, prepare meals, and run errands. °· Provide counseling. They will make sure your emotional, spiritual, and social needs and those of your family are being met. °· Provide spiritual care. Spiritual care is individualized to meet your needs and your family's needs. It may involve helping you look at what death means to you, say goodbye, or perform a specific religious ceremony or ritual. °Hospice teams often include: °· A nurse. °· A doctor. °· Social workers. °· Religious leaders (such as a chaplain). °· Trained volunteers. °WHEN SHOULD HOSPICE CARE BEGIN? °Most people who use hospice are believed to have fewer than 6 months to live. Your family and health care providers can help you decide when hospice services should begin. If your condition improves, you may discontinue the program. °WHAT SHOULD I CONSIDER BEFORE SELECTING A PROGRAM? °Most hospice programs are run by nonprofit, independent organizations. Some are affiliated with hospitals, nursing homes, or home health care agencies. Hospice programs can take place in the home or at a hospice center, hospital, or skilled nursing facility. When choosing  a hospice program, ask the following questions: °· What services are available to me? °· What services are offered to my loved ones? °· How involved are my loved ones? °· How involved is my health care provider? °· Who makes up the hospice care team? How are they trained or screened? °· How will my pain and symptoms be managed? °· If my circumstances change, can the services be provided in a different setting, such as my home or in the hospital? °· Is the program reviewed and licensed by the state or certified in some other way? °WHERE CAN I LEARN MORE ABOUT HOSPICE? °You can learn about existing hospice programs in your area from your health care providers. You can also read more about hospice online. The websites of the following organizations contain helpful information: °· The National Hospice and Palliative Care Organization (NHPCO). °· The Hospice Association of America (HAA). °· The Hospice Education Institute. °· The American Cancer Society (ACS). °· [facility] [facility]. °Document Released: 05/27/2003 Document Revised: 02/12/2013 Document Reviewed: 12/18/2012 °ExitCare® Patient Information ©2015 [employer]. This information is not intended to replace advice given to you by your health care provider. Make sure you discuss any questions you have with your health care provider. ° °

## 2014-05-20 NOTE — Progress Notes (Signed)
   Subjective:    Patient ID: Tracy BlinksMary Hicks, female    DOB: [birth year], 81 y.o.   MRN: [id number]  HPI  Patient reports with her son for wound recheck after two weeks.  Patient had a significant skin tear approximately 2 weeks ago.  He reports that she was doing significantly better, but then she fell forward in her walker and landed on both of her knees.  He reports that she is not drinking well and she is not eating well either.  He reports that her right leg has been swelling significantly.  He reports that the skin on her big toe on the right foot is to the point where it is splitting.  Her son is concerned because he thinks that she is dehydrated. He states that he has been approached by Hospice but does not think that his mother needs this.  He has to force her to get up and force her to drink water and eat.     Review of Systems Level 5 caveat secondary to dementia    Objective:   Physical Exam  Constitutional: She appears well-developed and well-nourished. No distress.  HENT:  Head: Normocephalic and atraumatic.  Mouth/Throat: Oropharynx is clear and moist. No oropharyngeal exudate.  Eyes: Conjunctivae are normal. Pupils are equal, round, and reactive to light. No scleral icterus.  Neck: Normal range of motion. Neck supple. No JVD present. No thyromegaly present.  Cardiovascular: Regular rhythm.  Exam reveals no gallop and no friction rub.   Murmur heard. 2+ pitting edema of the right leg  Pulmonary/Chest: Effort normal and breath sounds normal. No respiratory distress. She has no wheezes. She has no rales. She exhibits no tenderness.  Musculoskeletal: Normal range of motion.  Lymphadenopathy:    She has no cervical adenopathy.  Neurological: She is alert.  Skin: Skin is warm and dry. She is not diaphoretic.  Healing skin avulsion that is 6 in x 3 in on the right shin with no surrounding erythema or warmth.  No drainage.  Some granulation tissue evident.  Nursing note and vitals  reviewed.         Assessment & Plan:    1. Skin avulsion -appears to be healing without evidence of infection at this time -cont wound care  2. Peripheral edema -cont lasix -elevate feet as much as possible  3. Dementia, without behavioral disturbance -likely at End stage -discussed hospice care/comfort care -suggested no change in current therapy.  Over 40 minutes of exam, counseling, chart review and critical decision making was performed

## 2014-05-22 ENCOUNTER — Telehealth: Payer: Self-pay | Admitting: *Deleted

## 2014-05-22 NOTE — Telephone Encounter (Signed)
Tracy MessierKathy from Hospice called and states patient's son is requesting services.. Per Dr Oneta RackMcKeown, [place] for services and he will be the doctor if it works best for them.  Tracy MessierKathy agreed and records faxed.

## 2014-05-26 ENCOUNTER — Other Ambulatory Visit: Payer: Self-pay | Admitting: Internal Medicine

## 2014-06-04 ENCOUNTER — Other Ambulatory Visit: Payer: Self-pay | Admitting: Internal Medicine

## 2014-06-23 ENCOUNTER — Ambulatory Visit: Payer: Self-pay | Admitting: Internal Medicine

## 2014-06-28 ENCOUNTER — Other Ambulatory Visit: Payer: Self-pay | Admitting: Internal Medicine

## 2014-07-01 ENCOUNTER — Other Ambulatory Visit: Payer: Self-pay | Admitting: Internal Medicine

## 2014-07-04 ENCOUNTER — Ambulatory Visit: Payer: Self-pay | Admitting: Internal Medicine

## 2014-07-10 ENCOUNTER — Other Ambulatory Visit: Payer: Self-pay | Admitting: Internal Medicine

## 2014-07-24 ENCOUNTER — Other Ambulatory Visit: Payer: Self-pay | Admitting: Internal Medicine

## 2014-08-14 ENCOUNTER — Telehealth: Payer: Self-pay | Admitting: *Deleted

## 2014-08-14 MED ORDER — CEPHALEXIN 500 MG PO CAPS: 500.0000 mg | ORAL_CAPSULE | Freq: Three times a day (TID) | ORAL | Status: AC

## 2014-08-14 NOTE — Telephone Encounter (Signed)
Gina from Hospice called and reported patient has a red, warm area that is swollen with the skin a\cracking.  The area is around an area where the patient scratched her leg on a wheelchair.  Per Almira Coaster the area is cellulitis and requested an antibiotic.  RX sent to the Hospice pharmacy(fax-[phone]) for Keflex 500 mg # 30 1 cap tid for infection per Dr Oneta Rack.

## 2014-08-21 ENCOUNTER — Other Ambulatory Visit: Payer: Self-pay | Admitting: Physician Assistant

## 2014-09-13 IMAGING — CR DG CHEST 2V
2 series · 2 of 2 positions shown · non-contrast
Comparison: September 20, 2009.

CLINICAL DATA: Weakness, coronary artery disease

CHEST - 2 VIEW

[w chest lat]
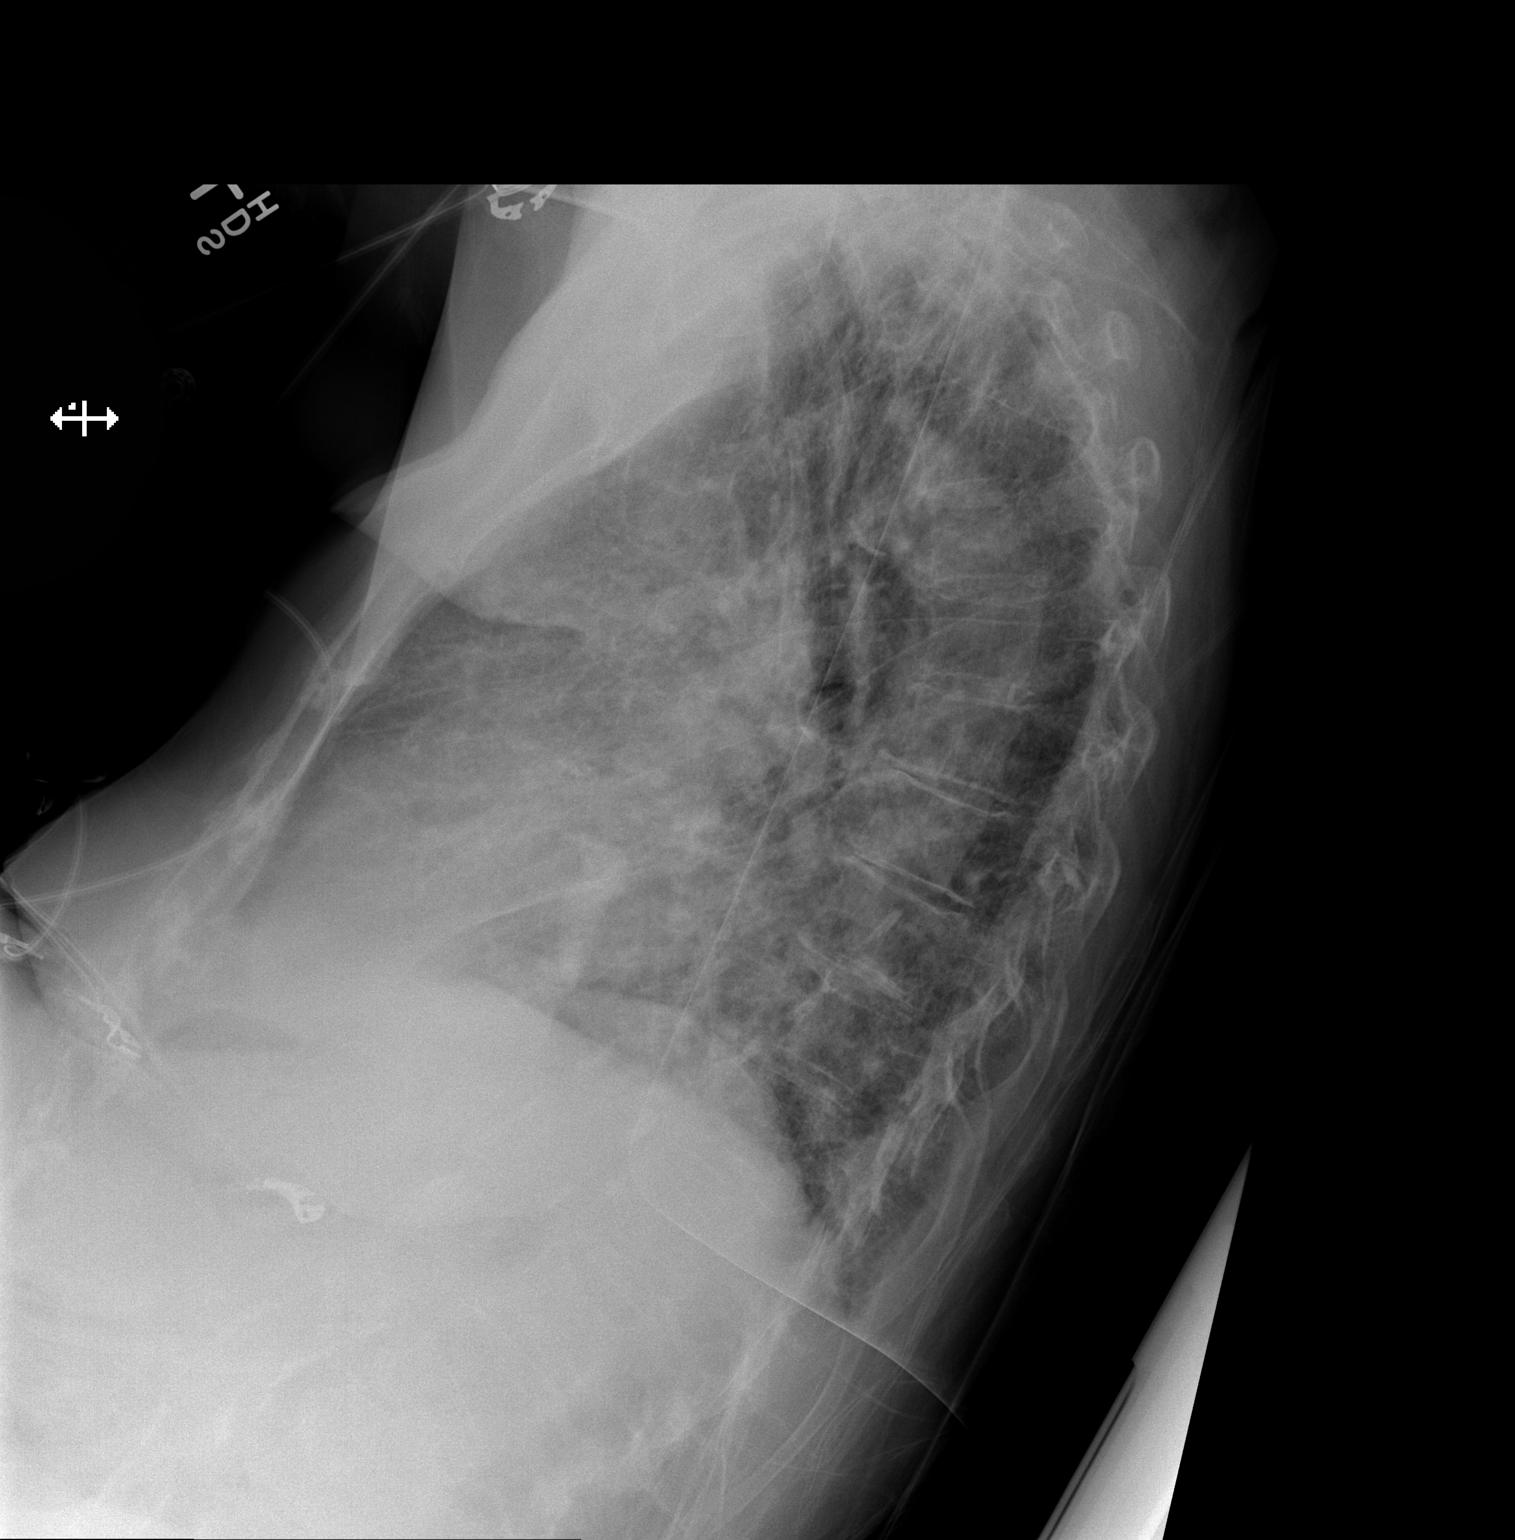

[x chest ap]
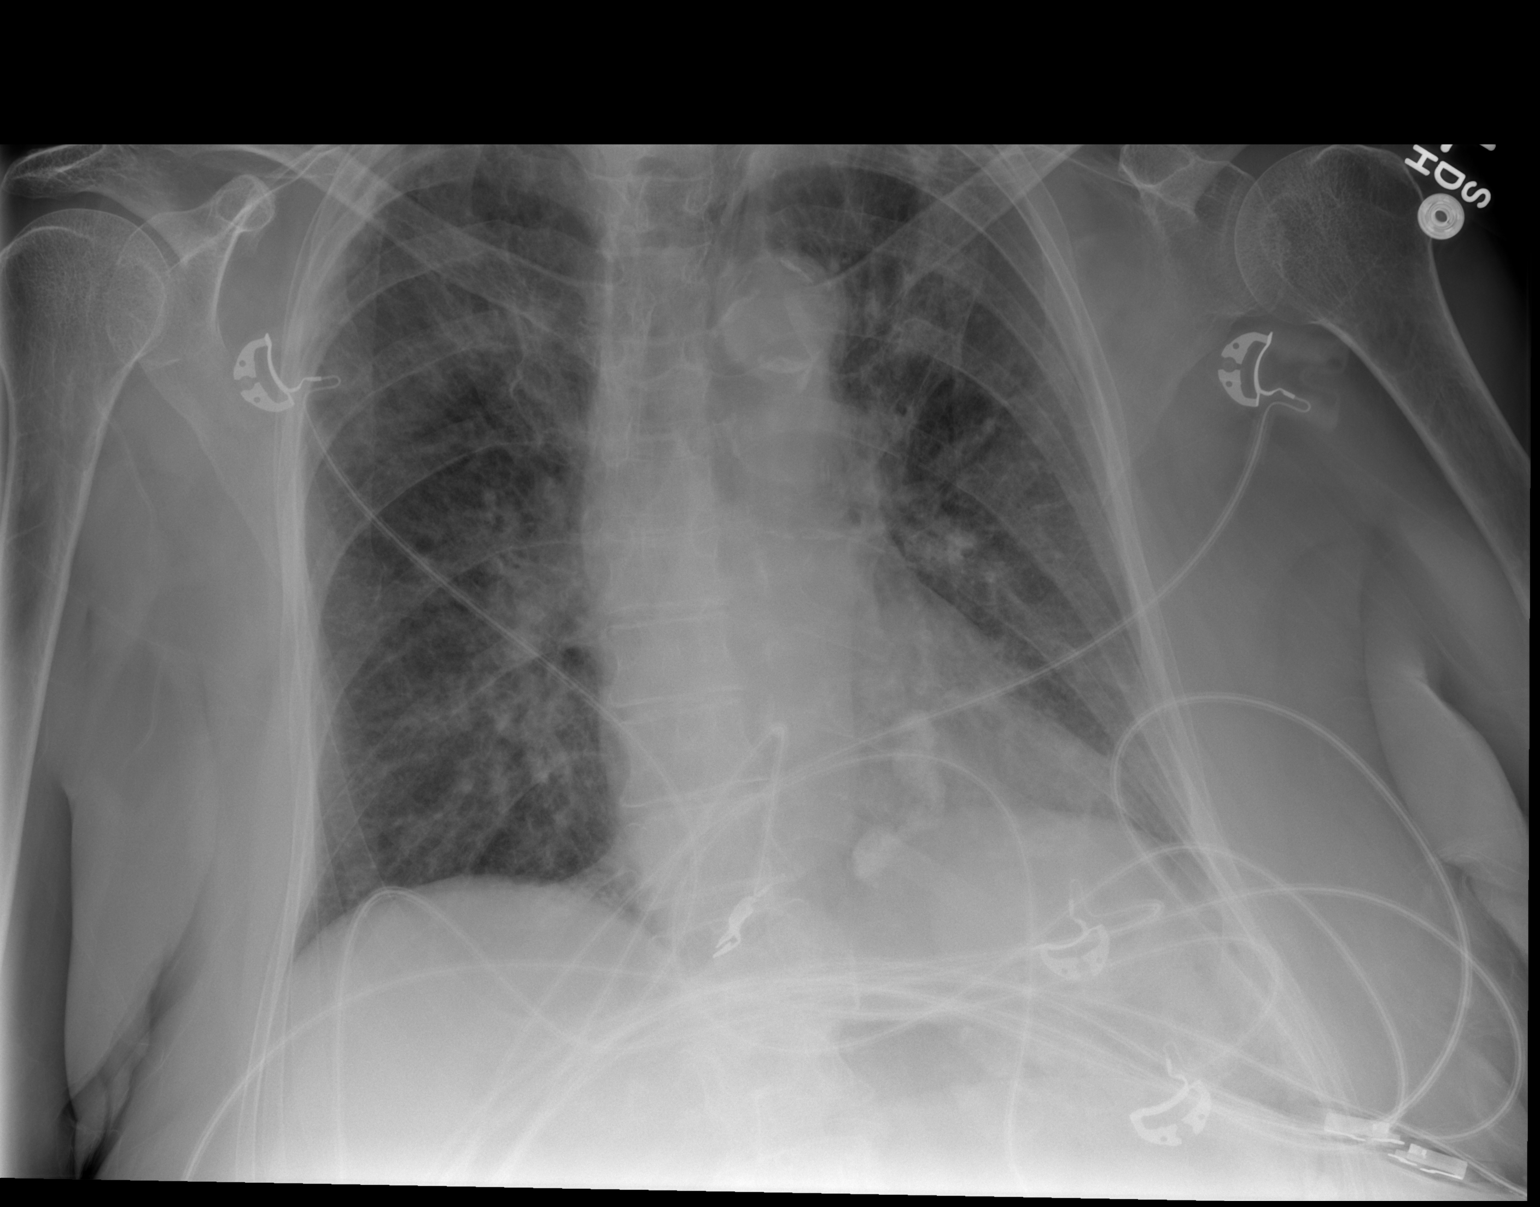

[2 of 2 positions shown; findings below may reference images not displayed]

FINDINGS: Cardiomediastinal silhouette appears normal.  No pleural
effusion or pneumothorax is noted. Mild central pulmonary vascular
congestion is noted with probable bilateral perihilar edema.
IMPRESSION: Mild central pulmonary vascular congestion is noted with probable
mild bilateral perihilar edema.

## 2014-09-14 IMAGING — CR DG CHEST 1V PORT
1 series · 1 of 1 positions shown · non-contrast
Comparison: 09/14/2012

CLINICAL DATA: Evaluate for infiltration.

PORTABLE CHEST - 1 VIEW

[AP]
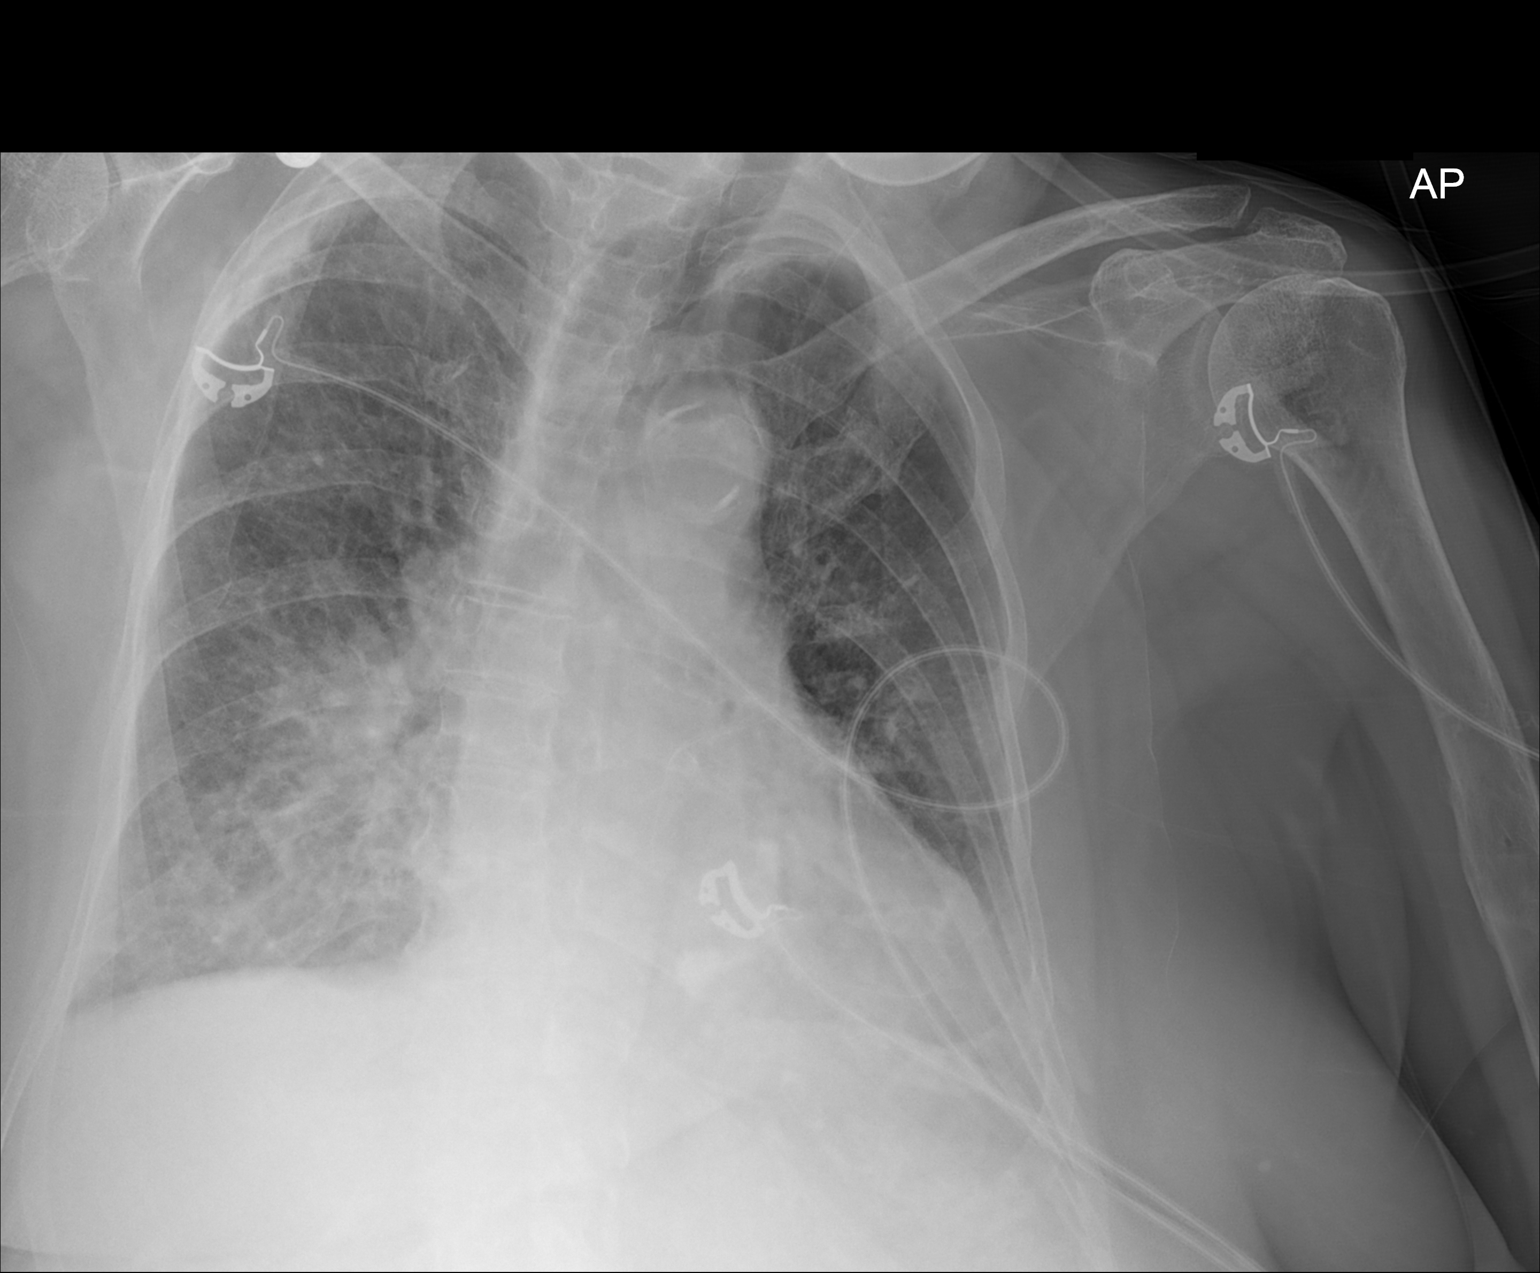

[1 of 1 positions shown; findings below may reference images not displayed]

FINDINGS: Cardiac enlargement with mild pulmonary vascular
congestion and perihilar edema.  Increasing infiltration in the
right lower lung.  No definite pneumothorax or effusion.
Calcification of the aorta.
IMPRESSION: Cardiac enlargement with pulmonary vascular congestion and
perihilar edema.  Increasing infiltration in the right lower lung.

## 2014-10-31 ENCOUNTER — Other Ambulatory Visit: Payer: Self-pay | Admitting: Physician Assistant

## 2014-11-29 ENCOUNTER — Other Ambulatory Visit: Payer: Self-pay | Admitting: Internal Medicine

## 2014-12-10 ENCOUNTER — Telehealth: Payer: Self-pay | Admitting: *Deleted

## 2014-12-10 NOTE — Telephone Encounter (Signed)
Tracy Hicks from Hospice called and reported a cyst on the the left side of her pubic area that drained bright red blood last PM.  Per Hospice, the area is still hard and appears to have blood or pus in it.  Per Hospice , the area does not look infected and is not painful.  Per Dr Oneta RackMcKeown, try applying warm soaks to the area and if there is no improvement, she will need an OV to evaluate.  Tracy Hicks aware.

## 2014-12-11 ENCOUNTER — Encounter: Payer: Self-pay | Admitting: Internal Medicine

## 2014-12-25 ENCOUNTER — Other Ambulatory Visit: Payer: Self-pay | Admitting: *Deleted

## 2014-12-25 ENCOUNTER — Telehealth: Payer: Self-pay | Admitting: *Deleted

## 2014-12-25 MED ORDER — ALPRAZOLAM 0.5 MG PO TABS: 0.5000 mg | ORAL_TABLET | Freq: Every evening | ORAL | Status: DC | PRN

## 2014-12-25 NOTE — Telephone Encounter (Signed)
Tracy Hicks called and states the patient has diarrhea at times.  Per Dr Oneta RackMcKeown, patient can try OTC Immodium for diarrhea.  Tracy Hicks aware.

## 2015-02-02 ENCOUNTER — Other Ambulatory Visit: Payer: Self-pay | Admitting: Internal Medicine

## 2015-02-02 ENCOUNTER — Other Ambulatory Visit: Payer: Self-pay | Admitting: *Deleted

## 2015-02-02 MED ORDER — ALPRAZOLAM 0.5 MG PO TABS: 0.5000 mg | ORAL_TABLET | Freq: Every evening | ORAL | Status: AC | PRN

## 2015-02-03 ENCOUNTER — Other Ambulatory Visit: Payer: Self-pay | Admitting: Internal Medicine

## 2015-03-16 ENCOUNTER — Other Ambulatory Visit: Payer: Self-pay | Admitting: *Deleted

## 2015-03-16 MED ORDER — BACITRACIN ZINC 500 UNIT/GM EX OINT: 1.0000 "application " | TOPICAL_OINTMENT | Freq: Two times a day (BID) | CUTANEOUS | Status: AC

## 2015-04-09 ENCOUNTER — Other Ambulatory Visit: Payer: Self-pay | Admitting: Internal Medicine

## 2015-04-22 DEATH — deceased

## 2015-04-30 ENCOUNTER — Other Ambulatory Visit: Payer: Self-pay | Admitting: Physician Assistant
